# Patient Record
Sex: Female | Born: 1980 | Race: White | Hispanic: No | Marital: Married | Smoking: Never smoker
Health system: Southern US, Community
[De-identification: ages and names within clinical notes are randomized; demographics above are authoritative.]

## PROBLEM LIST (undated history)

## (undated) ENCOUNTER — Inpatient Hospital Stay (HOSPITAL_COMMUNITY): Payer: Self-pay

## (undated) DIAGNOSIS — T8859XA Other complications of anesthesia, initial encounter: Secondary | ICD-10-CM

## (undated) DIAGNOSIS — K2970 Gastritis, unspecified, without bleeding: Secondary | ICD-10-CM

## (undated) DIAGNOSIS — R102 Pelvic and perineal pain: Secondary | ICD-10-CM

## (undated) DIAGNOSIS — Z87442 Personal history of urinary calculi: Secondary | ICD-10-CM

## (undated) DIAGNOSIS — M797 Fibromyalgia: Secondary | ICD-10-CM

## (undated) DIAGNOSIS — G43909 Migraine, unspecified, not intractable, without status migrainosus: Secondary | ICD-10-CM

## (undated) DIAGNOSIS — Z862 Personal history of diseases of the blood and blood-forming organs and certain disorders involving the immune mechanism: Secondary | ICD-10-CM

## (undated) DIAGNOSIS — Z969 Presence of functional implant, unspecified: Secondary | ICD-10-CM

## (undated) DIAGNOSIS — Z872 Personal history of diseases of the skin and subcutaneous tissue: Secondary | ICD-10-CM

## (undated) DIAGNOSIS — R002 Palpitations: Secondary | ICD-10-CM

## (undated) DIAGNOSIS — T4145XA Adverse effect of unspecified anesthetic, initial encounter: Secondary | ICD-10-CM

## (undated) HISTORY — DX: Migraine, unspecified, not intractable, without status migrainosus: G43.909

## (undated) HISTORY — PX: DILATION AND EVACUATION: SHX1459

## (undated) HISTORY — PX: FOOT SURGERY: SHX648

## (undated) HISTORY — PX: WISDOM TOOTH EXTRACTION: SHX21

---
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
## 2006-07-28 ENCOUNTER — Ambulatory Visit (HOSPITAL_COMMUNITY): Admission: RE | Admit: 2006-07-28 | Discharge: 2006-07-28 | Payer: Self-pay | Admitting: Obstetrics and Gynecology

## 2007-01-12 ENCOUNTER — Inpatient Hospital Stay (HOSPITAL_COMMUNITY): Admission: AD | Admit: 2007-01-12 | Discharge: 2007-01-12 | Payer: Self-pay | Admitting: Obstetrics and Gynecology

## 2007-08-07 ENCOUNTER — Inpatient Hospital Stay (HOSPITAL_COMMUNITY): Admission: AD | Admit: 2007-08-07 | Discharge: 2007-08-07 | Payer: Self-pay | Admitting: Obstetrics and Gynecology

## 2007-09-03 ENCOUNTER — Inpatient Hospital Stay (HOSPITAL_COMMUNITY): Admission: RE | Admit: 2007-09-03 | Discharge: 2007-09-03 | Payer: Self-pay | Admitting: Obstetrics and Gynecology

## 2007-10-06 ENCOUNTER — Ambulatory Visit (HOSPITAL_COMMUNITY): Admission: RE | Admit: 2007-10-06 | Discharge: 2007-10-06 | Payer: Self-pay | Admitting: Obstetrics and Gynecology

## 2007-10-06 ENCOUNTER — Encounter (INDEPENDENT_AMBULATORY_CARE_PROVIDER_SITE_OTHER): Payer: Self-pay | Admitting: Obstetrics and Gynecology

## 2007-10-09 ENCOUNTER — Encounter (INDEPENDENT_AMBULATORY_CARE_PROVIDER_SITE_OTHER): Payer: Self-pay | Admitting: Obstetrics and Gynecology

## 2007-10-09 ENCOUNTER — Ambulatory Visit (HOSPITAL_COMMUNITY): Admission: AD | Admit: 2007-10-09 | Discharge: 2007-10-09 | Payer: Self-pay | Admitting: Obstetrics and Gynecology

## 2008-06-25 ENCOUNTER — Inpatient Hospital Stay (HOSPITAL_COMMUNITY): Admission: AD | Admit: 2008-06-25 | Discharge: 2008-06-25 | Payer: Self-pay | Admitting: Obstetrics and Gynecology

## 2008-07-21 ENCOUNTER — Inpatient Hospital Stay (HOSPITAL_COMMUNITY): Admission: AD | Admit: 2008-07-21 | Discharge: 2008-07-21 | Payer: Self-pay | Admitting: Obstetrics and Gynecology

## 2008-08-17 ENCOUNTER — Inpatient Hospital Stay (HOSPITAL_COMMUNITY): Admission: AD | Admit: 2008-08-17 | Discharge: 2008-08-17 | Payer: Self-pay | Admitting: Obstetrics and Gynecology

## 2008-12-18 ENCOUNTER — Ambulatory Visit (HOSPITAL_COMMUNITY): Admission: RE | Admit: 2008-12-18 | Discharge: 2008-12-18 | Payer: Self-pay | Admitting: Obstetrics and Gynecology

## 2009-02-06 ENCOUNTER — Ambulatory Visit (HOSPITAL_COMMUNITY): Admission: RE | Admit: 2009-02-06 | Discharge: 2009-02-06 | Payer: Self-pay | Admitting: Gynecology

## 2009-03-26 ENCOUNTER — Ambulatory Visit: Payer: Self-pay | Admitting: Vascular Surgery

## 2009-03-26 ENCOUNTER — Ambulatory Visit: Admission: RE | Admit: 2009-03-26 | Discharge: 2009-03-26 | Payer: Self-pay | Admitting: Orthopedic Surgery

## 2009-03-26 ENCOUNTER — Encounter (INDEPENDENT_AMBULATORY_CARE_PROVIDER_SITE_OTHER): Payer: Self-pay | Admitting: Orthopedic Surgery

## 2010-04-02 ENCOUNTER — Ambulatory Visit: Payer: Self-pay | Admitting: Physician Assistant

## 2010-04-02 ENCOUNTER — Inpatient Hospital Stay (HOSPITAL_COMMUNITY): Admission: AD | Admit: 2010-04-02 | Discharge: 2010-04-02 | Payer: Self-pay | Admitting: Obstetrics and Gynecology

## 2010-05-17 ENCOUNTER — Inpatient Hospital Stay (HOSPITAL_COMMUNITY): Admission: AD | Admit: 2010-05-17 | Discharge: 2010-05-17 | Payer: Self-pay | Admitting: Obstetrics and Gynecology

## 2010-06-08 ENCOUNTER — Ambulatory Visit: Admission: RE | Admit: 2010-06-08 | Discharge: 2010-06-08 | Payer: Self-pay | Admitting: Obstetrics and Gynecology

## 2010-07-02 ENCOUNTER — Inpatient Hospital Stay (HOSPITAL_COMMUNITY): Admission: RE | Admit: 2010-07-02 | Discharge: 2010-07-05 | Payer: Self-pay | Admitting: Obstetrics and Gynecology

## 2010-12-31 LAB — CBC
HCT: 28.9 % — ABNORMAL LOW (ref 36.0–46.0)
HCT: 36.1 % (ref 36.0–46.0)
MCV: 91.6 fL (ref 78.0–100.0)
MCV: 90.4 fL (ref 78.0–100.0)
Platelets: 145 10*3/uL — ABNORMAL LOW (ref 150–400)
RBC: 3.15 MIL/uL — ABNORMAL LOW (ref 3.87–5.11)
RBC: 3.99 MIL/uL (ref 3.87–5.11)
WBC: 13.8 10*3/uL — ABNORMAL HIGH (ref 4.0–10.5)
WBC: 12.1 10*3/uL — ABNORMAL HIGH (ref 4.0–10.5)

## 2010-12-31 LAB — SURGICAL PCR SCREEN: MRSA, PCR: NEGATIVE

## 2011-01-02 LAB — URINALYSIS, ROUTINE W REFLEX MICROSCOPIC
Glucose, UA: NEGATIVE mg/dL
Leukocytes, UA: NEGATIVE
Protein, ur: NEGATIVE mg/dL
Specific Gravity, Urine: 1.015 (ref 1.005–1.030)
Urobilinogen, UA: 0.2 mg/dL (ref 0.0–1.0)

## 2011-01-02 LAB — URINE CULTURE
Colony Count: NO GROWTH
Culture: NO GROWTH

## 2011-01-02 LAB — URINE MICROSCOPIC-ADD ON

## 2011-01-03 LAB — URINE MICROSCOPIC-ADD ON

## 2011-01-03 LAB — URINALYSIS, ROUTINE W REFLEX MICROSCOPIC
Bilirubin Urine: NEGATIVE
Leukocytes, UA: NEGATIVE
Nitrite: NEGATIVE
Specific Gravity, Urine: >1.03 — ABNORMAL HIGH (ref 1.005–1.030)
Urobilinogen, UA: 0.2 mg/dL (ref 0.0–1.0)
pH: 6 (ref 5.0–8.0)

## 2011-03-02 NOTE — Op Note (Signed)
NAME:  Alicia Wade, Alicia Wade             ACCOUNT NO.:  [id number]   MEDICAL RECORD NO.:  [id number]          PATIENT TYPE:  AMB   LOCATION:  [facility]                          FACILITY:  [facility]   PHYSICIAN:  Guy Sandifer. Henderson Cloud, M.D. DATE OF BIRTH:  10/06/1981   DATE OF PROCEDURE:  10/09/2007  DATE OF DISCHARGE:                               OPERATIVE REPORT   PREOPERATIVE DIAGNOSIS:  Menorrhagia, possible retained products of  conception.   POSTOPERATIVE DIAGNOSIS:  Menorrhagia, possible retained products of  conception.   PROCEDURE:  Dilatation evacuation with ultrasound guidance, and 1%  Xylocaine paracervical block.   SURGEON:  Guy Sandifer. Henderson Cloud, M.D.   ANESTHESIA:  MAC.   ANESTHESIOLOGIST:  Belva Agee, M.D.   SPECIMENS:  Endometrial curettings to pathology.   ESTIMATED BLOOD LOSS:  Minimal.   INDICATIONS AND CONSENT:  The patient is a 30 year old married white  female, status post D&E for a spontaneous AB 3 days ago.  She reports a  temperature of 100.3 on the first postoperative day, and approximately  99.5 yesterday.  Late last evening she had the onset of heavy cramping  that doubled her over.  It initially started with voiding, but then  generalized.  She was given Methergine, which was of no help.  She has  been on Macrobid for several days to treat a UTI.  She had some mild  nausea but no vomiting.  No bowel movement, but she has a history of  constipation.   Evaluation in triage revealed stable vital signs and she is afebrile.  The abdomen is flat, soft, with positive bowel sounds.  There is mild  tenderness in the lower quadrants bilaterally, with no rebound or  masses.   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  No flow was detected that would definitely be consistent with  POC.  White count is 8.9.  The possibility of a small amount of retained  POC or possibly simply clot decidua that is unable to pass the cervix is  discussed  with the patient and her mother.  Dilatation evacuation is  discussed.  Potential risks and complications were reviewed  preoperatively, including but limited to: infection, uterine  perforation, organ damage, bleeding requiring transfusion of blood  products with possible transfusion reaction, HIV and hepatitis  acquisition, DVT, PE, pneumonia, intrauterine synechia and secondary  infertility, and hysterectomy.  All questions were answered and consent  signed on the chart.   PROCEDURE:  The patient was taken to the operating room, where she was  identified, placed in the dorsal supine position and given intravenous  sedation.  She was then placed in the dorsal lithotomy position, where  she was prepped and the bladder straight catheterized.  Then she was  draped in a sterile fashion.  The anterior cervical lip was injected  with 1% Xylocaine plain, and grasped with a single-tooth tenaculum.  Approximately 20 mL of 1% plain Xylocaine was injected at the 2, 4, 5,  7, 8 and 10 o'clock positions.  The cervix was then gently and  progressively dilated to a 27  dilator.  Then, using simultaneous  ultrasound to visualize location, suction curettage was initially  carried out with a flexible #6 curet.  This was productive of a small  amount of clot.  Sharp curettage was then done into each fundus.  This  was followed with suction curettage with a #6 rigid curved suction  curet.  Several passes were made with each, and the cavity was clean.  There was good hemostasis.  All instruments were removed.  The patient  was taken to the recovery room in stable condition.   She was given Cipro 200 mg IV at the beginning of the case.  She will be  discharged home on Levaquin 500 mg daily (#5).  Will continue her on  Methergine for the next 1-2 days.   Instructions were reviewed.   FOLLOW-UP:  In the office in 1-2 weeks.      Guy Sandifer Henderson Cloud, M.D.  Electronically Signed     JET/MEDQ  D:   10/09/2007  T:  10/10/2007  Job:  [id number]

## 2011-03-02 NOTE — Op Note (Signed)
NAME:  Alicia Wade, Alicia Wade             ACCOUNT NO.:  [id number]   MEDICAL RECORD NO.:  [id number]          PATIENT TYPE:  AMB   LOCATION:  [facility]                           FACILITY:  [facility]   PHYSICIAN:  Michelle L. Grewal, M.D.DATE OF BIRTH:  1981-10-18   DATE OF PROCEDURE:  10/06/2007  DATE OF DISCHARGE:                               OPERATIVE REPORT   PREOPERATIVE DIAGNOSIS:  Missed abortion.   POSTOPERATIVE DIAGNOSIS:  Missed abortion.   PROCEDURE:  Dilatation and evacuation with chromosomes.   SURGEON:  Michelle L. Vincente Poli, M.D.   ANESTHESIA:  MAC with local.   FINDINGS:  Products of conception.   PROCEDURE IN DETAIL:  The patient was taken to the operating room after  informed consent was obtained.  She was then prepped and draped in the  usual sterile fashion.  In-and-out catheter was used to empty the  bladder.  The speculum was inserted.  The cervix was visualized and a  paracervical block was performed.  The cervical internal os was gently  dilated using Pratt dilators.  A #7 suction cannula was inserted into  the uterus and the uterus was thoroughly curetted. I did see some dark  clots and some tissue consistent with products of conception.  The  suction curette was removed and a sharp curette was inserted and the  uterus was then again curetted. A final suction curettage was performed.  The uterine cavity was clean.  All instruments were removed from the  vagina.  All sponge, lap and instrument counts were correct x2.      Michelle L. Vincente Poli, M.D.  Electronically Signed     MLG/MEDQ  D:  10/06/2007  T:  10/07/2007  Job:  [id number]

## 2011-07-23 LAB — CBC
HCT: 34.8 — ABNORMAL LOW
Hemoglobin: 12.3
MCHC: 34.9
MCHC: 35.4
MCV: 85.5
Platelets: 235
Platelets: 239
RDW: 12.2
RDW: 12.4
WBC: 6.3

## 2011-07-23 LAB — URINALYSIS, ROUTINE W REFLEX MICROSCOPIC
Nitrite: NEGATIVE
Protein, ur: NEGATIVE
Specific Gravity, Urine: 1.015
Urobilinogen, UA: 0.2

## 2011-07-23 LAB — URINE MICROSCOPIC-ADD ON

## 2011-07-23 LAB — ABO/RH: ABO/RH(D): A POS

## 2012-01-11 LAB — OB RESULTS CONSOLE ANTIBODY SCREEN: Antibody Screen: NEGATIVE

## 2012-01-11 LAB — OB RESULTS CONSOLE RPR: RPR: NONREACTIVE

## 2012-04-24 ENCOUNTER — Encounter (HOSPITAL_COMMUNITY): Payer: Self-pay

## 2012-04-24 ENCOUNTER — Observation Stay (HOSPITAL_COMMUNITY)
Admission: AD | Admit: 2012-04-24 | Discharge: 2012-04-27 | Disposition: A | Discharge status: Home / Self Care | Payer: BC Managed Care – PPO | Source: Ambulatory Visit | Attending: Obstetrics and Gynecology | Admitting: Obstetrics and Gynecology

## 2012-04-24 ENCOUNTER — Inpatient Hospital Stay (HOSPITAL_COMMUNITY): Payer: BC Managed Care – PPO

## 2012-04-24 DIAGNOSIS — O3400 Maternal care for unspecified congenital malformation of uterus, unspecified trimester: Secondary | ICD-10-CM | POA: Insufficient documentation

## 2012-04-24 DIAGNOSIS — R1013 Epigastric pain: Secondary | ICD-10-CM | POA: Insufficient documentation

## 2012-04-24 DIAGNOSIS — Q513 Bicornate uterus: Secondary | ICD-10-CM | POA: Insufficient documentation

## 2012-04-24 DIAGNOSIS — O99891 Other specified diseases and conditions complicating pregnancy: Principal | ICD-10-CM | POA: Insufficient documentation

## 2012-04-24 LAB — COMPREHENSIVE METABOLIC PANEL
AST: 19 U/L (ref 0–37)
BUN: 7 mg/dL (ref 6–23)
CO2: 25 mEq/L (ref 19–32)
Calcium: 8.7 mg/dL (ref 8.4–10.5)
Chloride: 103 mEq/L (ref 96–112)
Creatinine, Ser: 0.46 mg/dL — ABNORMAL LOW (ref 0.50–1.10)
GFR calc Af Amer: >90 mL/min (ref >90)
GFR calc non Af Amer: >90 mL/min (ref >90)
Glucose, Bld: 88 mg/dL (ref 70–99)
Total Bilirubin: 0.2 mg/dL — ABNORMAL LOW (ref 0.3–1.2)

## 2012-04-24 LAB — URIC ACID: Uric Acid, Serum: 4.2 mg/dL (ref 2.4–7.0)

## 2012-04-24 LAB — CBC WITH DIFFERENTIAL/PLATELET
Eosinophils Relative: 1 % (ref 0–5)
HCT: 30.7 % — ABNORMAL LOW (ref 36.0–46.0)
Hemoglobin: 10.4 g/dL — ABNORMAL LOW (ref 12.0–15.0)
Lymphocytes Relative: 14 % (ref 12–46)
Lymphs Abs: 1.5 10*3/uL (ref 0.7–4.0)
MCV: 87.7 fL (ref 78.0–100.0)
Monocytes Absolute: 0.5 10*3/uL (ref 0.1–1.0)
Monocytes Relative: 5 % (ref 3–12)
Neutro Abs: 8 10*3/uL — ABNORMAL HIGH (ref 1.7–7.7)
RDW: 14 % (ref 11.5–15.5)
WBC: 10 10*3/uL (ref 4.0–10.5)

## 2012-04-24 LAB — URINALYSIS, ROUTINE W REFLEX MICROSCOPIC
Bilirubin Urine: NEGATIVE
Glucose, UA: NEGATIVE mg/dL
Hgb urine dipstick: NEGATIVE
Protein, ur: NEGATIVE mg/dL
Specific Gravity, Urine: 1.01 (ref 1.005–1.030)

## 2012-04-24 LAB — AMYLASE: Amylase: 60 U/L (ref 0–105)

## 2012-04-24 MED ORDER — ONDANSETRON HCL 4 MG/2ML IJ SOLN
4.0000 mg | Freq: Once | INTRAMUSCULAR | Status: AC
  Administered 2012-04-24: 4 mg via INTRAVENOUS
  Filled 2012-04-24: qty 2

## 2012-04-24 MED ORDER — LACTATED RINGERS IV SOLN
INTRAVENOUS | Status: DC
  Administered 2012-04-24 – 2012-04-26 (×5): via INTRAVENOUS

## 2012-04-24 MED ORDER — LACTATED RINGERS IV SOLN
INTRAVENOUS | Status: DC
  Administered 2012-04-24 – 2012-04-27 (×2): via INTRAVENOUS

## 2012-04-24 MED ORDER — ONDANSETRON HCL 4 MG/2ML IJ SOLN
4.0000 mg | Freq: Four times a day (QID) | INTRAMUSCULAR | Status: DC | PRN
  Administered 2012-04-24 – 2012-04-26 (×2): 4 mg via INTRAVENOUS
  Filled 2012-04-24 (×5): qty 2

## 2012-04-24 MED ORDER — HYDROMORPHONE HCL PF 1 MG/ML IJ SOLN
1.0000 mg | INTRAMUSCULAR | Status: DC | PRN
  Administered 2012-04-24 – 2012-04-26 (×7): 1 mg via INTRAVENOUS
  Filled 2012-04-24 (×7): qty 1

## 2012-04-24 MED ORDER — DIPHENHYDRAMINE HCL 12.5 MG/5ML PO ELIX: 12.5000 mg | ORAL_SOLUTION | Freq: Four times a day (QID) | ORAL | Status: DC | PRN

## 2012-04-24 MED ORDER — HYDROMORPHONE HCL PF 1 MG/ML IJ SOLN
0.5000 mg | Freq: Once | INTRAMUSCULAR | Status: AC
  Administered 2012-04-24: 0.5 mg via INTRAVENOUS
  Filled 2012-04-24: qty 1

## 2012-04-24 MED ORDER — DIPHENHYDRAMINE HCL 50 MG/ML IJ SOLN
12.5000 mg | Freq: Four times a day (QID) | INTRAMUSCULAR | Status: DC | PRN
  Administered 2012-04-26: 12.5 mg via INTRAVENOUS
  Filled 2012-04-24: qty 1

## 2012-04-24 MED ORDER — HYDROMORPHONE HCL PF 1 MG/ML IJ SOLN
0.5000 mg | Freq: Once | INTRAMUSCULAR | Status: AC
  Administered 2012-04-24: 11:00:00 via INTRAVENOUS
  Filled 2012-04-24: qty 1

## 2012-04-24 MED ORDER — ONDANSETRON HCL 4 MG/2ML IJ SOLN
4.0000 mg | Freq: Four times a day (QID) | INTRAMUSCULAR | Status: DC | PRN
  Administered 2012-04-24 – 2012-04-25 (×4): 4 mg via INTRAVENOUS
  Filled 2012-04-24: qty 2

## 2012-04-24 MED ORDER — NALOXONE HCL 0.4 MG/ML IJ SOLN: 0.4000 mg | INTRAMUSCULAR | Status: DC | PRN

## 2012-04-24 MED ORDER — SODIUM CHLORIDE 0.9 % IJ SOLN: 9.0000 mL | INTRAMUSCULAR | Status: DC | PRN

## 2012-04-24 MED ORDER — FAMOTIDINE IN NACL 20-0.9 MG/50ML-% IV SOLN
20.0000 mg | Freq: Once | INTRAVENOUS | Status: AC
  Administered 2012-04-24: 20 mg via INTRAVENOUS
  Filled 2012-04-24: qty 50

## 2012-04-24 MED ORDER — HYDROMORPHONE 0.3 MG/ML IV SOLN: INTRAVENOUS | Status: DC

## 2012-04-24 NOTE — H&P (Signed)
Alicia Wade is a 31 y.o. female presenting at 23 weeks with abdominal pain. Reports the onset of epigastric discomfort with radiation to both lower quadrants and the umbilical area. She has had some nausea associated with this. Other change in bowel habits. Denies any fever. Patient does state she has a history of kidney stones.        Rations been evaluated and maternity admissions. We did an ultrasound of the gallbladder and upper abdomen which was negative. No signs of gallstones or obstructing kidney stones. The ultrasound revealed normal amniotic fluid and movement. There was no sign of abruption. Monitoring revealed no contractions and a reassuring fetal heart rate. She had no uterine tenderness. At this point time her daughter bring her in for observation overnight. Certainly be a developing appendicitis. Although I think this is unlikely.    Maternal Medical History:  Reason for admission: Reason for admission: nausea.  Fetal activity: Perceived fetal activity is normal.      OB History    Grav Para Term Preterm Abortions TAB SAB Ect Mult Living   4 1 1  2  2   1      Past Medical History  Diagnosis Date  . Bicornate uterus   . Anemia   . Kidney stones    Past Surgical History  Procedure Date  . Cesarean section   . Wisdom tooth extraction   . Foot surgery   . Dilation and curettage of uterus    Family History: family history is negative for Other. Social History:  reports that she has never smoked. She has never used smokeless tobacco. She reports that she does not drink alcohol or use illicit drugs.   Prenatal Transfer Tool   Genetic Screening: Normal Maternal Ultrasounds/Referrals: Normal Fetal Ultrasounds or other Referrals:  None Maternal Substance Abuse:  No Significant Maternal Medications:  None Significant Maternal Lab Results:  None Other Comments:  None  Review of Systems  Constitutional: Positive for fever. Negative for chills.  Respiratory:  Negative for cough, shortness of breath and wheezing.   Gastrointestinal: Positive for nausea and abdominal pain. Negative for diarrhea, constipation, blood in stool and melena.  Genitourinary: Negative for dysuria, urgency, frequency and hematuria.      Blood pressure 115/71, pulse 77, temperature 98.5 F (36.9 C), temperature source Oral, resp. rate 16, height 5' 2.5" (1.588 m), weight 67.042 kg (147 lb 12.8 oz). Exam Physical Exam  Constitutional: She is oriented to person, place, and time. She appears well-developed and well-nourished.  HENT:  Head: Normocephalic and atraumatic.  Mouth/Throat: Oropharynx is clear and moist.  Eyes: Conjunctivae and EOM are normal. Pupils are equal, round, and reactive to light.  Cardiovascular: Normal rate, regular rhythm and normal heart sounds.   Respiratory: Effort normal and breath sounds normal.  GI: Soft. She exhibits no distension and no mass. There is tenderness. There is no rebound and no guarding.  Musculoskeletal: She exhibits no edema and no tenderness.  Neurological: She is alert and oriented to person, place, and time. She has normal reflexes.    Prenatal labs: ABO, Rh:   Antibody:   Rubella:   RPR:    HBsAg:    HIV:    GBS:     Assessment/Plan: Treated in pregnancy at 23 weeks. Nominal discomfort of undetermined etiology  A she will brought in for observation tonight. We'll repeat labs tomorrow. If she has any signs of worsening symptomatology a general surgical consultation will be obtained. Potential we may need  to do a CT scan to rule out appendicitis.   Alicia Wade S 04/24/2012, 2:01 PM

## 2012-04-24 NOTE — MAU Provider Note (Signed)
History     CSN: [id number]  Arrival date & time 04/24/12  0645   None     Chief Complaint  Patient presents with  . Abdominal Pain    HPI 08:00 am Alicia Wade is a 31 y.o. female @ [redacted]w[redacted]d gestation who presents to MAU for abdominal pain. The pain started approximately 3 am. She describes the pain as a sharp, constant and is equal to kidney stone pain. She reports nausea but no vomiting. The pain is located in the upper abdomen and radiates to the epigastric area. She has never had this pain before. The history was provided by the patient.  Past Medical History  Diagnosis Date  . Bicornate uterus   . Anemia   . Kidney stones     Past Surgical History  Procedure Date  . Cesarean section   . Wisdom tooth extraction   . Foot surgery   . Dilation and curettage of uterus     Family History  Problem Relation Age of Onset  . Other Neg Hx     History  Substance Use Topics  . Smoking status: Never Smoker   . Smokeless tobacco: Never Used  . Alcohol Use: No    OB History    Grav Para Term Preterm Abortions TAB SAB Ect Mult Living   4 1 1  2  2   1       Review of Systems  Constitutional: Negative for fever and chills.  HENT: Negative.   Eyes: Negative.   Respiratory: Negative.   Cardiovascular: Negative.   Gastrointestinal: Positive for nausea, vomiting and abdominal pain.  Genitourinary: Negative for dysuria, frequency, flank pain, vaginal bleeding, vaginal discharge and pelvic pain.  Musculoskeletal: Negative for back pain.  Skin: Negative.   Neurological: Negative for headaches.  Psychiatric/Behavioral: Negative for confusion and agitation.    Allergies  Augmentin; Penicillins; and Zithromax  Home Medications  No current outpatient prescriptions on file.  BP 115/71  Pulse 77  Temp 98.5 F (36.9 C) (Oral)  Resp 16  Ht 5' 2.5" (1.588 m)  Wt 147 lb 12.8 oz (67.042 kg)  BMI 26.60 kg/m2  Physical Exam  Nursing note and vitals  reviewed. Constitutional: She is oriented to person, place, and time. She appears well-developed and well-nourished. No distress.  HENT:  Head: Normocephalic.  Eyes: EOM are normal.  Neck: Neck supple.  Cardiovascular: Normal rate.   Pulmonary/Chest: Effort normal.  Abdominal: Soft. There is tenderness in the right upper quadrant, epigastric area and left upper quadrant. There is no CVA tenderness.  Musculoskeletal: Normal range of motion.  Neurological: She is alert and oriented to person, place, and time. No cranial nerve deficit.  Skin: Skin is warm and dry.  Psychiatric: She has a normal mood and affect. Her behavior is normal. Judgment and thought content normal.     ED Course: 08:15 am Dr. Arelia Sneddon in to evaluate the patient.  Procedures Assessment: 31 y.o. @ [redacted]w[redacted]d with abdominal pain  Plan:    Labs, ultrasound, IV, Dilaudid, Pepcid and Zofran ordered   Dr. Arelia Sneddon in to continue care of patient and admission   MDM

## 2012-04-24 NOTE — Progress Notes (Signed)
Patient admitted with a pain scale of 2, states she is more comfortable now than this morning. Pt stated she would rather not have the pca/oxygen for relief and wouuld like to try prn meds for now. Will notify md.

## 2012-04-24 NOTE — MAU Note (Signed)
Patient states she woke up at 0300 with waves of stabbing intense pain in the epigastric area that shoots down the middle of the abdomen. Denies any leaking or bleeding and reports fetal movement.

## 2012-04-24 NOTE — MAU Note (Signed)
Pt states started having pain in upper abdomen at 0330, rates pain at 7-8/10. Has waves of increased intensity. Denies abnormal vaginal discharge or bleeding.

## 2012-04-25 LAB — CBC
HCT: 30.3 % — ABNORMAL LOW (ref 36.0–46.0)
Hemoglobin: 10.2 g/dL — ABNORMAL LOW (ref 12.0–15.0)
MCHC: 33.7 g/dL (ref 30.0–36.0)
RDW: 14.2 % (ref 11.5–15.5)
WBC: 7.5 10*3/uL (ref 4.0–10.5)

## 2012-04-25 MED ORDER — FAMOTIDINE IN NACL 20-0.9 MG/50ML-% IV SOLN
20.0000 mg | Freq: Two times a day (BID) | INTRAVENOUS | Status: DC
  Administered 2012-04-25 (×2): 20 mg via INTRAVENOUS
  Filled 2012-04-25 (×3): qty 50

## 2012-04-25 NOTE — Progress Notes (Signed)
UR Chart review completed.  

## 2012-04-25 NOTE — Progress Notes (Signed)
Patient ID: Alicia Wade, female   DOB: Feb 23, 1981, 31 y.o.   MRN: [id number] S: epigastric pain continues , rates 5/10, No radiation this am. Vomited x 1 last pm . Some nausea this am. Denies contracttions, LOF or VB O: VSS Abd sof,t + BS, slight tenderness epigastric area with palpitation  No CVAT, Gravid uterus, + FM  Labs WNL with WBC decreased- 7.5  Abd ultrasound neg , no evidence or GB thickening or stones, no evidence of kidney stone or obstruction OB ultrasound with normal amniotic fluid , normal appearing placenta , Uterus noted to be bicornate with fetus in R horn A: IUP 23 weeks with abd pain of uncertain etiology  P: Pepcid 20 mg IV q 12 hrs  Change diet to bland diet to see how she tolerates

## 2012-04-26 MED ORDER — FAMOTIDINE 20 MG PO TABS
20.0000 mg | ORAL_TABLET | Freq: Two times a day (BID) | ORAL | Status: DC
  Administered 2012-04-26 (×2): 20 mg via ORAL
  Filled 2012-04-26 (×2): qty 1

## 2012-04-26 MED ORDER — HYDROCODONE-ACETAMINOPHEN 5-325 MG PO TABS
1.0000 | ORAL_TABLET | ORAL | Status: DC | PRN
  Administered 2012-04-26 – 2012-04-27 (×5): 1 via ORAL
  Filled 2012-04-26 (×5): qty 1

## 2012-04-26 MED ORDER — ONDANSETRON 4 MG PO TBDP
4.0000 mg | ORAL_TABLET | Freq: Three times a day (TID) | ORAL | Status: DC | PRN
  Filled 2012-04-26: qty 1

## 2012-04-26 NOTE — Progress Notes (Signed)
Patient ID: Alicia Wade, female   DOB: [birth year], 31 y.o.   MRN: [id number] S: Epigastric burning and regurg significantly improved. Continues to experience stabbing epigastric pain but less frequent. Denies contractions, LOF , or VB. +FM. Reports some periumbilical pain/pressure with sitting O: VSS, Abd soft + BM yesterday. Tolerated soft diet yesterday Gravid uterus 2 above umbilicus A: 23 weeks IUP with epigastric pain, improving P: Change IV meds to po Consider discontinuing IV fluids later this afternoon if tolerates po meds and diet

## 2012-04-26 NOTE — Progress Notes (Signed)
Ur chart review completed.  

## 2012-04-27 MED ORDER — ONDANSETRON 4 MG PO TBDP: 4.0000 mg | ORAL_TABLET | Freq: Three times a day (TID) | ORAL | Status: AC | PRN

## 2012-04-27 MED ORDER — PANTOPRAZOLE SODIUM 40 MG PO TBEC: 40.0000 mg | DELAYED_RELEASE_TABLET | Freq: Every day | ORAL | Status: DC

## 2012-04-27 MED ORDER — HYDROCODONE-ACETAMINOPHEN 5-325 MG PO TABS: 1.0000 | ORAL_TABLET | ORAL | Status: AC | PRN

## 2012-04-27 MED ORDER — PANTOPRAZOLE SODIUM 40 MG PO TBEC
40.0000 mg | DELAYED_RELEASE_TABLET | Freq: Every day | ORAL | Status: DC
  Administered 2012-04-27: 40 mg via ORAL
  Filled 2012-04-27 (×2): qty 1

## 2012-04-27 MED ORDER — DOCUSATE SODIUM 100 MG PO CAPS
100.0000 mg | ORAL_CAPSULE | Freq: Every day | ORAL | Status: DC
  Administered 2012-04-27: 100 mg via ORAL
  Filled 2012-04-27: qty 1

## 2012-04-27 NOTE — Discharge Summary (Signed)
Physician Discharge Summary  Patient ID: Alicia Wade MRN: [id number] DOB/AGE: 1980-10-19 30 y.o.  Admit date: 04/24/2012 Discharge date: 04/27/2012  Admission Diagnoses: 23 week IUP admitted with epigastric pain  Discharge Diagnoses:  23 week IUP, gastritis Active Problems:  * No active hospital problems. *    Discharged Condition: stable  Hospital Course: 30 yr WMF admitted aat [redacted] weeks gestation with complaints of epigatric pain with radiation to umbilicus. ABd and pelvic ultrasound were performed which did not reveal evidence of gallstones or slug or wall thickening. No evidence of renal calculi was noted.  Amniotic fluid was normal as well as placenta. Uterus was noted to be bicornate and fetus was seen in the R horn.  Patient was started on IV fluids, pain meds and Pepcid. Diet was later started which she tolerated well. No further nausea. Patient was discharged home on pain meds and protonix  Consults: None  Significant Diagnostic Studies: labs: TSH, CBC, urinalysis Abd ultasound and OB ultrasound  Treatments: IV hydration, analgesia: Dilaudid and PPI  Discharge Exam: Blood pressure 98/63, pulse 67, temperature 98.2 F (36.8 C), temperature source Oral, resp. rate 16, height 5' 2.5" (1.588 m), weight 66.679 kg (147 lb), SpO2 99.00%. General appearance: alert and cooperative Abd soft mild epigatric tenderness , gravid uterus 3 above umbilicus + FM, negative flank pain . No vag bleeding, LOF   Disposition:    Medication List  As of 04/27/2012  2:03 PM   STOP taking these medications         ranitidine 150 MG tablet         TAKE these medications         docusate sodium 100 MG capsule   Commonly known as: COLACE   Take 100 mg by mouth at bedtime.      HYDROcodone-acetaminophen 5-325 MG per tablet   Commonly known as: NORCO   Take 1 tablet by mouth every 4 (four) hours as needed.      ondansetron 4 MG disintegrating tablet   Commonly known as: ZOFRAN-ODT   Take  1 tablet (4 mg total) by mouth every 8 (eight) hours as needed.      pantoprazole 40 MG tablet   Commonly known as: PROTONIX   Take 1 tablet (40 mg total) by mouth daily at 12 noon.      prenatal multivitamin Tabs   Take 1 tablet by mouth at bedtime.             SignedJudith Blonder 04/27/2012, 2:03 PM

## 2012-04-27 NOTE — Progress Notes (Signed)
Subjective:[redacted]w[redacted]d  Patient reports tolerating PO.    Objective: I have reviewed patient's vital signs, medications, labs and radiology results. BP 90/55  Pulse 85  Temp 98.1 F (36.7 C) (Oral)  Resp 16  Ht 5' 2.5" (1.588 m)  Wt 147 lb (66.679 kg)  BMI 26.46 kg/m2  SpO2 100% fundus nontender, mildly tender @ epigastrium, no reb   Assessment/Plan: prob gastritis, NEG abd [place] with normal labs, feeling better , D/C on PO Protonix + bland diet, check back in office 1 wk  LOS: 3 days    Hemi Chacko M 04/27/2012, 7:58 AM

## 2012-04-29 ENCOUNTER — Inpatient Hospital Stay (HOSPITAL_COMMUNITY)
Admission: AD | Admit: 2012-04-29 | Discharge: 2012-04-29 | Disposition: A | Discharge status: Home / Self Care | Payer: BC Managed Care – PPO | Source: Ambulatory Visit | Attending: Obstetrics and Gynecology | Admitting: Obstetrics and Gynecology

## 2012-04-29 ENCOUNTER — Encounter (HOSPITAL_COMMUNITY): Payer: Self-pay

## 2012-04-29 DIAGNOSIS — R109 Unspecified abdominal pain: Secondary | ICD-10-CM

## 2012-04-29 DIAGNOSIS — O4700 False labor before 37 completed weeks of gestation, unspecified trimester: Secondary | ICD-10-CM

## 2012-04-29 LAB — URINALYSIS, ROUTINE W REFLEX MICROSCOPIC
Hgb urine dipstick: NEGATIVE
Leukocytes, UA: NEGATIVE
Protein, ur: NEGATIVE mg/dL
Specific Gravity, Urine: >1.03 — ABNORMAL HIGH (ref 1.005–1.030)
Urobilinogen, UA: 0.2 mg/dL (ref 0.0–1.0)

## 2012-04-29 NOTE — MAU Note (Signed)
Patient brought straight in to rm 3. In with c/o contractions all day and since 2015 more frequent. She denies any vaginal bleeding, lof or discharge. She reports good fetal movement.

## 2012-04-29 NOTE — MAU Provider Note (Signed)
History     CSN: [id number]  Arrival date and time: 04/29/12 2118   First Provider Initiated Contact with Patient 04/29/12 2139      Chief Complaint  Patient presents with  . Contractions   HPI Alicia Wade 30 y.o. [redacted]w[redacted]d  Having contractions today since 1 pm.  Contractions progressed until having 4 contractions in one hour with lower abdominal pain and low back pain.  Concerned about preterm labor especially with history of bicornate uterus.  Was just discharged from the hospital on 04-24-12 after evaluation of upper abdominal pain.  Did take one vicodin for pain at home today at 3 pm.  Still continued to have lower abdominal pain.  OB History    Grav Para Term Preterm Abortions TAB SAB Ect Mult Living   4 1 1  2  2   1       Past Medical History  Diagnosis Date  . Bicornate uterus   . Anemia   . Kidney stones     Past Surgical History  Procedure Date  . Cesarean section   . Wisdom tooth extraction   . Foot surgery   . Dilation and curettage of uterus     Family History  Problem Relation Age of Onset  . Other Neg Hx     History  Substance Use Topics  . Smoking status: Never Smoker   . Smokeless tobacco: Never Used  . Alcohol Use: No    Allergies:  Allergies  Allergen Reactions  . Augmentin (Amoxicillin-Pot Clavulanate) Diarrhea and Nausea And Vomiting  . Penicillins Hives  . Zithromax (Azithromycin) Diarrhea and Nausea And Vomiting    Prescriptions prior to admission  Medication Sig Dispense Refill  . docusate sodium (COLACE) 100 MG capsule Take 100 mg by mouth at bedtime.      Marland Kitchen HYDROcodone-acetaminophen (NORCO) 5-325 MG per tablet Take 1 tablet by mouth every 4 (four) hours as needed.  30 tablet  0  . ondansetron (ZOFRAN-ODT) 4 MG disintegrating tablet Take 1 tablet (4 mg total) by mouth every 8 (eight) hours as needed.  20 tablet  0  . pantoprazole (PROTONIX) 40 MG tablet Take 1 tablet (40 mg total) by mouth daily at 12 noon.  30 tablet  1  .  Prenatal Vit-Fe Fumarate-FA (PRENATAL MULTIVITAMIN) TABS Take 1 tablet by mouth at bedtime.        ROS Physical Exam   Blood pressure 104/63, pulse 90, temperature 99.2 F (37.3 C), temperature source Oral, resp. rate 18.  Physical Exam  Nursing note and vitals reviewed. Constitutional: She is oriented to person, place, and time. She appears well-developed and well-nourished.  HENT:  Head: Normocephalic.  Eyes: EOM are normal.  Neck: Neck supple.  GI: Soft. There is no tenderness.       No contractions seen on monitor. FHT stable.  Baby moving well.  Genitourinary:       Speculum exam: Vagina - Mod amount of creamy discharge, no odor Cervix - No contact bleeding Bimanual exam: Cervix closed, thick Chaperone present for exam.  Musculoskeletal: Normal range of motion.  Neurological: She is alert and oriented to person, place, and time.  Skin: Skin is warm and dry.  Psychiatric: She has a normal mood and affect.    MAU Course  Procedures Results for orders placed during the hospital encounter of 04/29/12 (from the past 24 hour(s))  URINALYSIS, ROUTINE W REFLEX MICROSCOPIC     Status: Abnormal   Collection Time   04/29/12  9:24 PM      Component Value Range   Color, Urine YELLOW  YELLOW   APPearance CLEAR  CLEAR   Specific Gravity, Urine >1.030 (*) 1.005 - 1.030   pH 5.5  5.0 - 8.0   Glucose, UA NEGATIVE  NEGATIVE mg/dL   Hgb urine dipstick NEGATIVE  NEGATIVE   Bilirubin Urine NEGATIVE  NEGATIVE   Ketones, ur NEGATIVE  NEGATIVE mg/dL   Protein, ur NEGATIVE  NEGATIVE mg/dL   Urobilinogen, UA 0.2  0.0 - 1.0 mg/dL   Nitrite NEGATIVE  NEGATIVE   Leukocytes, UA NEGATIVE  NEGATIVE   MDM 2200  Consult with Dr. Renaldo Fiddler re: plan of care  Assessment and Plan  Abd pain in pregnancy  Plan Drink at least 8 8-oz glasses of water every day. Likely pain today is due to mild dehydration Keep appointments in the office. Call your doctor if you have questions or  concerns.  BURLESON,TERRI 04/29/2012, 10:01 PM

## 2012-05-28 ENCOUNTER — Encounter (HOSPITAL_COMMUNITY): Payer: Self-pay | Admitting: *Deleted

## 2012-05-28 ENCOUNTER — Encounter (HOSPITAL_COMMUNITY): Payer: Self-pay | Admitting: Advanced Practice Midwife

## 2012-05-28 ENCOUNTER — Inpatient Hospital Stay (HOSPITAL_COMMUNITY)
Admission: AD | Admit: 2012-05-28 | Discharge: 2012-05-28 | Disposition: A | Discharge status: Home / Self Care | Payer: BC Managed Care – PPO | Source: Ambulatory Visit | Attending: Obstetrics and Gynecology | Admitting: Obstetrics and Gynecology

## 2012-05-28 DIAGNOSIS — O26859 Spotting complicating pregnancy, unspecified trimester: Secondary | ICD-10-CM | POA: Insufficient documentation

## 2012-05-28 DIAGNOSIS — O4700 False labor before 37 completed weeks of gestation, unspecified trimester: Secondary | ICD-10-CM | POA: Insufficient documentation

## 2012-05-28 DIAGNOSIS — O4690 Antepartum hemorrhage, unspecified, unspecified trimester: Secondary | ICD-10-CM

## 2012-05-28 DIAGNOSIS — R109 Unspecified abdominal pain: Secondary | ICD-10-CM | POA: Insufficient documentation

## 2012-05-28 LAB — URINALYSIS, ROUTINE W REFLEX MICROSCOPIC
Glucose, UA: NEGATIVE mg/dL
Ketones, ur: NEGATIVE mg/dL
Leukocytes, UA: NEGATIVE
Nitrite: NEGATIVE
Protein, ur: NEGATIVE mg/dL
Urobilinogen, UA: 0.2 mg/dL (ref 0.0–1.0)

## 2012-05-28 NOTE — MAU Note (Signed)
Pt reports when she went to restroom at 1800 she had some spotting. She has had preterm contractions but no cervical change. Reports bicornate uterus. States she has had "discomfort"  In lower abd and on left side all weekend.

## 2012-05-28 NOTE — MAU Provider Note (Signed)
History     CSN: [id number]  Arrival date and time: 05/28/12 2052   First Provider Initiated Contact with Patient 05/28/12 2148      No chief complaint on file.  HPI This is a 31 y.o. female at [redacted]w[redacted]d who presents with c/o cramping and spotting this evening (blood on tissue).  Has a known Bicornuate uterus.  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  RN Note: Pt presents with abd pain since Friday with increase in cramping and spotting today at 1800. Pt has hx of c/s, bicornorate uterus, and kidney stones. Pt states it does not feel like kidney stones but she and her s/o felt a "lump in her lower ledt abd close to her c/s incision where the pain is increasing today.   OB History    Grav Para Term Preterm Abortions TAB SAB Ect Mult Living   4 1 1  2  2   1       Past Medical History  Diagnosis Date  . Bicornate uterus   . Anemia   . Kidney stones     Past Surgical History  Procedure Date  . Cesarean section   . Wisdom tooth extraction   . Foot surgery   . Dilation and curettage of uterus     Family History  Problem Relation Age of Onset  . Other Neg Hx     History  Substance Use Topics  . Smoking status: Never Smoker   . Smokeless tobacco: Never Used  . Alcohol Use: No    Allergies:  Allergies  Allergen Reactions  . Augmentin (Amoxicillin-Pot Clavulanate) Diarrhea and Nausea And Vomiting  . Penicillins Hives  . Zithromax (Azithromycin) Diarrhea and Nausea And Vomiting    Prescriptions prior to admission  Medication Sig Dispense Refill  . docusate sodium (COLACE) 100 MG capsule Take 100 mg by mouth at bedtime.      Marland Kitchen Fe Cbn-Fe Gluc-FA-B12-C-DSS (FERRALET 90 PO) Take 1 tablet by mouth daily.      . pantoprazole (PROTONIX) 40 MG tablet Take 1 tablet (40 mg total) by mouth daily at 12 noon.  30 tablet  1  . Prenatal Vit-Fe Fumarate-FA (PRENATAL MULTIVITAMIN) TABS Take 1 tablet by mouth at bedtime.        ROS As in  HPI  Physical Exam   Blood pressure 99/66, pulse 82, temperature 98.2 F (36.8 C), temperature source Oral, resp. rate 18.  Physical Exam  Constitutional: She is oriented to person, place, and time. She appears well-developed and well-nourished. No distress.  Cardiovascular: Normal rate.   Respiratory: Effort normal.  GI: Soft. She exhibits no distension and no mass. There is tenderness (mild,over left side of old C/S scar). There is no rebound and no guarding.       Fetal small parts palpable under left side of scar  Genitourinary: Uterus normal. Vaginal discharge (white, mucous, no evidence of blood anywhere in vault) found.       Cervix long/closed  Musculoskeletal: Normal range of motion. She exhibits no edema.  Neurological: She is alert and oriented to person, place, and time.  Skin: Skin is warm and dry.  Psychiatric: She has a normal mood and affect.   Dilation: Closed Effacement (%): Thick Station: -3 Exam by:: m. Marithza Malachi, cnm  FHR reassuring No contractions seen. States has only felt one Results for orders placed during the hospital encounter of 05/28/12 (from the past 24 hour(s))  URINALYSIS, ROUTINE W  REFLEX MICROSCOPIC     Status: Abnormal   Collection Time   05/28/12  8:15 PM      Component Value Range   Color, Urine YELLOW  YELLOW   APPearance CLEAR  CLEAR   Specific Gravity, Urine <1.005 (*) 1.005 - 1.030   pH 6.5  5.0 - 8.0   Glucose, UA NEGATIVE  NEGATIVE mg/dL   Hgb urine dipstick NEGATIVE  NEGATIVE   Bilirubin Urine NEGATIVE  NEGATIVE   Ketones, ur NEGATIVE  NEGATIVE mg/dL   Protein, ur NEGATIVE  NEGATIVE mg/dL   Urobilinogen, UA 0.2  0.0 - 1.0 mg/dL   Nitrite NEGATIVE  NEGATIVE   Leukocytes, UA NEGATIVE  NEGATIVE    MAU Course  Procedures  Assessment and Plan  A:  SIUP at [redacted]w[redacted]d       Braxton Hicks contractions without cervical change      No evidence of vaginal bleeding      Left ligament pain vs pain in scar tissue  P:  Discussed with Dr  Arelia Sneddon      Discharge      Comfort measures      Bleeding precautions      Keep appt for tomorrow in office  [facility] 05/28/2012, 10:07 PM

## 2012-05-28 NOTE — MAU Note (Signed)
Pt presents with abd pain since Friday with increase in cramping and spotting today at 1800.  Pt has hx of c/s, bicornorate uterus, and kidney stones.  Pt states it does not feel like kidney stones but she and her s/o felt a "lump in her lower ledt abd close to her c/s incision where the pain is increasing today.

## 2012-07-27 ENCOUNTER — Encounter (HOSPITAL_COMMUNITY): Payer: Self-pay | Admitting: Student-PharmD

## 2012-07-31 ENCOUNTER — Encounter (HOSPITAL_COMMUNITY): Payer: Self-pay | Admitting: *Deleted

## 2012-07-31 ENCOUNTER — Inpatient Hospital Stay (HOSPITAL_COMMUNITY)
Admission: RE | Admit: 2012-07-31 | Discharge: 2012-08-03 | DRG: 371 | Disposition: A | Discharge status: Home / Self Care | Payer: BC Managed Care – PPO | Source: Ambulatory Visit | Attending: Obstetrics and Gynecology | Admitting: Obstetrics and Gynecology

## 2012-07-31 ENCOUNTER — Encounter (HOSPITAL_COMMUNITY): Payer: Self-pay | Admitting: Anesthesiology

## 2012-07-31 ENCOUNTER — Inpatient Hospital Stay (HOSPITAL_COMMUNITY): Payer: BC Managed Care – PPO | Admitting: Anesthesiology

## 2012-07-31 ENCOUNTER — Encounter (HOSPITAL_COMMUNITY)
Admission: RE | Disposition: A | Discharge status: Home / Self Care | Payer: Self-pay | Source: Ambulatory Visit | Attending: Obstetrics and Gynecology

## 2012-07-31 DIAGNOSIS — O34219 Maternal care for unspecified type scar from previous cesarean delivery: Principal | ICD-10-CM | POA: Diagnosis present

## 2012-07-31 DIAGNOSIS — O321XX0 Maternal care for breech presentation, not applicable or unspecified: Secondary | ICD-10-CM | POA: Diagnosis present

## 2012-07-31 LAB — CBC
HCT: 34.1 % — ABNORMAL LOW (ref 36.0–46.0)
Hemoglobin: 11.4 g/dL — ABNORMAL LOW (ref 12.0–15.0)
RBC: 3.87 MIL/uL (ref 3.87–5.11)
WBC: 10.5 10*3/uL (ref 4.0–10.5)

## 2012-07-31 LAB — TYPE AND SCREEN
ABO/RH(D): A POS
Antibody Screen: NEGATIVE

## 2012-07-31 LAB — SURGICAL PCR SCREEN
MRSA, PCR: NEGATIVE
Staphylococcus aureus: NEGATIVE

## 2012-07-31 SURGERY — Surgical Case
Anesthesia: Spinal | Site: Abdomen | Wound class: Clean Contaminated

## 2012-07-31 MED ORDER — SENNOSIDES-DOCUSATE SODIUM 8.6-50 MG PO TABS
2.0000 | ORAL_TABLET | Freq: Every day | ORAL | Status: DC
  Administered 2012-07-31 – 2012-08-02 (×3): 2 via ORAL

## 2012-07-31 MED ORDER — ONDANSETRON HCL 4 MG/2ML IJ SOLN: 4.0000 mg | INTRAMUSCULAR | Status: DC | PRN

## 2012-07-31 MED ORDER — EPHEDRINE 5 MG/ML INJ
INTRAVENOUS | Status: AC
  Filled 2012-07-31: qty 10

## 2012-07-31 MED ORDER — ONDANSETRON HCL 4 MG/2ML IJ SOLN
INTRAMUSCULAR | Status: DC | PRN
  Administered 2012-07-31: 4 mg via INTRAVENOUS

## 2012-07-31 MED ORDER — PROMETHAZINE HCL 25 MG/ML IJ SOLN: 6.2500 mg | INTRAMUSCULAR | Status: DC | PRN

## 2012-07-31 MED ORDER — HYDROMORPHONE HCL PF 1 MG/ML IJ SOLN: 0.2500 mg | INTRAMUSCULAR | Status: DC | PRN

## 2012-07-31 MED ORDER — SCOPOLAMINE 1 MG/3DAYS TD PT72
1.0000 | MEDICATED_PATCH | Freq: Once | TRANSDERMAL | Status: DC
  Administered 2012-07-31: 1.5 mg via TRANSDERMAL

## 2012-07-31 MED ORDER — LACTATED RINGERS IV SOLN
INTRAVENOUS | Status: DC
  Administered 2012-07-31 (×2): via INTRAVENOUS

## 2012-07-31 MED ORDER — MENTHOL 3 MG MT LOZG: 1.0000 | LOZENGE | OROMUCOSAL | Status: DC | PRN

## 2012-07-31 MED ORDER — SIMETHICONE 80 MG PO CHEW
80.0000 mg | CHEWABLE_TABLET | Freq: Three times a day (TID) | ORAL | Status: DC
  Administered 2012-07-31 – 2012-08-03 (×8): 80 mg via ORAL

## 2012-07-31 MED ORDER — PRENATAL MULTIVITAMIN CH
1.0000 | ORAL_TABLET | Freq: Every day | ORAL | Status: DC
  Administered 2012-08-01 – 2012-08-03 (×3): 1 via ORAL
  Filled 2012-07-31 (×3): qty 1

## 2012-07-31 MED ORDER — ZOLPIDEM TARTRATE 5 MG PO TABS: 5.0000 mg | ORAL_TABLET | Freq: Every evening | ORAL | Status: DC | PRN

## 2012-07-31 MED ORDER — KETOROLAC TROMETHAMINE 60 MG/2ML IM SOLN
INTRAMUSCULAR | Status: AC
  Administered 2012-07-31: 60 mg via INTRAMUSCULAR
  Filled 2012-07-31: qty 2

## 2012-07-31 MED ORDER — SCOPOLAMINE 1 MG/3DAYS TD PT72
MEDICATED_PATCH | TRANSDERMAL | Status: AC
  Administered 2012-07-31: 1.5 mg via TRANSDERMAL
  Filled 2012-07-31: qty 1

## 2012-07-31 MED ORDER — LACTATED RINGERS IV SOLN
INTRAVENOUS | Status: DC
  Administered 2012-08-01: via INTRAVENOUS

## 2012-07-31 MED ORDER — DIPHENHYDRAMINE HCL 25 MG PO CAPS: 25.0000 mg | ORAL_CAPSULE | Freq: Four times a day (QID) | ORAL | Status: DC | PRN

## 2012-07-31 MED ORDER — NALBUPHINE HCL 10 MG/ML IJ SOLN
5.0000 mg | INTRAMUSCULAR | Status: DC | PRN
  Administered 2012-07-31 – 2012-08-01 (×2): 10 mg via SUBCUTANEOUS
  Filled 2012-07-31 (×2): qty 1

## 2012-07-31 MED ORDER — DIPHENHYDRAMINE HCL 50 MG/ML IJ SOLN: 12.5000 mg | INTRAMUSCULAR | Status: DC | PRN

## 2012-07-31 MED ORDER — HYDROMORPHONE HCL PF 1 MG/ML IJ SOLN
INTRAMUSCULAR | Status: AC
  Filled 2012-07-31: qty 1

## 2012-07-31 MED ORDER — TETANUS-DIPHTH-ACELL PERTUSSIS 5-2.5-18.5 LF-MCG/0.5 IM SUSP: 0.5000 mL | Freq: Once | INTRAMUSCULAR | Status: DC

## 2012-07-31 MED ORDER — OXYTOCIN 40 UNITS IN LACTATED RINGERS INFUSION - SIMPLE MED: 62.5000 mL/h | INTRAVENOUS | Status: AC

## 2012-07-31 MED ORDER — FENTANYL CITRATE 0.05 MG/ML IJ SOLN
INTRAMUSCULAR | Status: DC | PRN
  Administered 2012-07-31: 62.5 ug via INTRAVENOUS
  Administered 2012-07-31: 12.5 ug via INTRATHECAL

## 2012-07-31 MED ORDER — MORPHINE SULFATE (PF) 0.5 MG/ML IJ SOLN
INTRAMUSCULAR | Status: DC | PRN
  Administered 2012-07-31: .15 mg via INTRATHECAL

## 2012-07-31 MED ORDER — PHENYLEPHRINE HCL 10 MG/ML IJ SOLN
INTRAMUSCULAR | Status: DC | PRN
  Administered 2012-07-31 (×2): 40 ug via INTRAVENOUS
  Administered 2012-07-31: 80 ug via INTRAVENOUS
  Administered 2012-07-31: 40 ug via INTRAVENOUS

## 2012-07-31 MED ORDER — MEPERIDINE HCL 25 MG/ML IJ SOLN: 6.2500 mg | INTRAMUSCULAR | Status: DC | PRN

## 2012-07-31 MED ORDER — EPHEDRINE SULFATE 50 MG/ML IJ SOLN
INTRAMUSCULAR | Status: DC | PRN
  Administered 2012-07-31 (×3): 10 mg via INTRAVENOUS

## 2012-07-31 MED ORDER — OXYTOCIN 10 UNIT/ML IJ SOLN
40.0000 [IU] | INTRAVENOUS | Status: DC | PRN
  Administered 2012-07-31: 40 [IU] via INTRAVENOUS

## 2012-07-31 MED ORDER — KETOROLAC TROMETHAMINE 30 MG/ML IJ SOLN: 30.0000 mg | Freq: Four times a day (QID) | INTRAMUSCULAR | Status: AC | PRN

## 2012-07-31 MED ORDER — DIBUCAINE 1 % RE OINT: 1.0000 "application " | TOPICAL_OINTMENT | RECTAL | Status: DC | PRN

## 2012-07-31 MED ORDER — ONDANSETRON HCL 4 MG/2ML IJ SOLN: 4.0000 mg | Freq: Three times a day (TID) | INTRAMUSCULAR | Status: DC | PRN

## 2012-07-31 MED ORDER — PHENYLEPHRINE 40 MCG/ML (10ML) SYRINGE FOR IV PUSH (FOR BLOOD PRESSURE SUPPORT)
PREFILLED_SYRINGE | INTRAVENOUS | Status: AC
  Filled 2012-07-31: qty 5

## 2012-07-31 MED ORDER — WITCH HAZEL-GLYCERIN EX PADS: 1.0000 "application " | MEDICATED_PAD | CUTANEOUS | Status: DC | PRN

## 2012-07-31 MED ORDER — OXYTOCIN 10 UNIT/ML IJ SOLN
INTRAMUSCULAR | Status: AC
  Filled 2012-07-31: qty 4

## 2012-07-31 MED ORDER — SODIUM CHLORIDE 0.9 % IV SOLN
1.0000 ug/kg/h | INTRAVENOUS | Status: DC | PRN
  Filled 2012-07-31: qty 2.5

## 2012-07-31 MED ORDER — IBUPROFEN 600 MG PO TABS
600.0000 mg | ORAL_TABLET | Freq: Four times a day (QID) | ORAL | Status: DC
  Administered 2012-07-31 – 2012-08-03 (×10): 600 mg via ORAL
  Filled 2012-07-31 (×10): qty 1

## 2012-07-31 MED ORDER — HYDROMORPHONE HCL PF 1 MG/ML IJ SOLN: 1.0000 mg | Freq: Once | INTRAMUSCULAR | Status: DC

## 2012-07-31 MED ORDER — NALOXONE HCL 0.4 MG/ML IJ SOLN: 0.4000 mg | INTRAMUSCULAR | Status: DC | PRN

## 2012-07-31 MED ORDER — BUPIVACAINE IN DEXTROSE 0.75-8.25 % IT SOLN
INTRATHECAL | Status: DC | PRN
  Administered 2012-07-31: 1.4 mg via INTRATHECAL

## 2012-07-31 MED ORDER — SIMETHICONE 80 MG PO CHEW: 80.0000 mg | CHEWABLE_TABLET | ORAL | Status: DC | PRN

## 2012-07-31 MED ORDER — MORPHINE SULFATE 0.5 MG/ML IJ SOLN
INTRAMUSCULAR | Status: AC
  Filled 2012-07-31: qty 10

## 2012-07-31 MED ORDER — DIPHENHYDRAMINE HCL 25 MG PO CAPS
25.0000 mg | ORAL_CAPSULE | ORAL | Status: DC | PRN
  Filled 2012-07-31: qty 1

## 2012-07-31 MED ORDER — POTASSIUM CL IN DEXTROSE 5% 20 MEQ/L IV SOLN: 20.0000 meq | INTRAVENOUS | Status: DC

## 2012-07-31 MED ORDER — ONDANSETRON HCL 4 MG PO TABS: 4.0000 mg | ORAL_TABLET | ORAL | Status: DC | PRN

## 2012-07-31 MED ORDER — KETOROLAC TROMETHAMINE 30 MG/ML IJ SOLN: 15.0000 mg | Freq: Once | INTRAMUSCULAR | Status: DC | PRN

## 2012-07-31 MED ORDER — OXYCODONE-ACETAMINOPHEN 5-325 MG PO TABS
1.0000 | ORAL_TABLET | ORAL | Status: DC | PRN
  Administered 2012-08-01 – 2012-08-03 (×10): 1 via ORAL
  Filled 2012-07-31 (×10): qty 1

## 2012-07-31 MED ORDER — FENTANYL CITRATE 0.05 MG/ML IJ SOLN
INTRAMUSCULAR | Status: AC
  Filled 2012-07-31: qty 2

## 2012-07-31 MED ORDER — DIPHENHYDRAMINE HCL 50 MG/ML IJ SOLN: 25.0000 mg | INTRAMUSCULAR | Status: DC | PRN

## 2012-07-31 MED ORDER — GENTAMICIN SULFATE 40 MG/ML IJ SOLN
INTRAVENOUS | Status: DC
  Filled 2012-07-31: qty 8.34

## 2012-07-31 MED ORDER — ONDANSETRON HCL 4 MG/2ML IJ SOLN
INTRAMUSCULAR | Status: AC
  Filled 2012-07-31: qty 2

## 2012-07-31 MED ORDER — LACTATED RINGERS IV SOLN
INTRAVENOUS | Status: DC
  Administered 2012-07-31: 13:00:00 via INTRAVENOUS

## 2012-07-31 MED ORDER — GENTAMICIN SULFATE 40 MG/ML IJ SOLN
Freq: Once | INTRAVENOUS | Status: AC
  Administered 2012-07-31: 100 mL via INTRAVENOUS
  Filled 2012-07-31: qty 8.34

## 2012-07-31 MED ORDER — KETOROLAC TROMETHAMINE 60 MG/2ML IM SOLN
60.0000 mg | Freq: Once | INTRAMUSCULAR | Status: AC | PRN
  Administered 2012-07-31: 60 mg via INTRAMUSCULAR

## 2012-07-31 MED ORDER — METOCLOPRAMIDE HCL 5 MG/ML IJ SOLN: 10.0000 mg | Freq: Three times a day (TID) | INTRAMUSCULAR | Status: DC | PRN

## 2012-07-31 MED ORDER — SODIUM CHLORIDE 0.9 % IJ SOLN: 3.0000 mL | INTRAMUSCULAR | Status: DC | PRN

## 2012-07-31 MED ORDER — LANOLIN HYDROUS EX OINT: 1.0000 "application " | TOPICAL_OINTMENT | CUTANEOUS | Status: DC | PRN

## 2012-07-31 MED ORDER — NALBUPHINE HCL 10 MG/ML IJ SOLN
5.0000 mg | INTRAMUSCULAR | Status: DC | PRN
  Filled 2012-07-31 (×2): qty 1

## 2012-07-31 SURGICAL SUPPLY — 19 items
ADH SKN CLS APL DERMABOND .7 (GAUZE/BANDAGES/DRESSINGS) ×1
CLOTH BEACON ORANGE TIMEOUT ST (SAFETY) ×2 IMPLANT
DERMABOND ADVANCED (GAUZE/BANDAGES/DRESSINGS) ×1
DERMABOND ADVANCED .7 DNX12 (GAUZE/BANDAGES/DRESSINGS) ×1 IMPLANT
DURAPREP 26ML APPLICATOR (WOUND CARE) ×2 IMPLANT
ELECT REM PT RETURN 9FT ADLT (ELECTROSURGICAL) ×2
ELECTRODE REM PT RTRN 9FT ADLT (ELECTROSURGICAL) ×1 IMPLANT
GLOVE BIO SURGEON STRL SZ 6.5 (GLOVE) ×4 IMPLANT
GOWN PREVENTION PLUS LG XLONG (DISPOSABLE) ×6 IMPLANT
NS IRRIG 1000ML POUR BTL (IV SOLUTION) ×2 IMPLANT
PACK C SECTION WH (CUSTOM PROCEDURE TRAY) ×2 IMPLANT
PAD OB MATERNITY 4.3X12.25 (PERSONAL CARE ITEMS) ×2 IMPLANT
SUT CHROMIC 0 CTX 36 (SUTURE) ×4 IMPLANT
SUT VIC AB 0 CT1 27 (SUTURE) ×6
SUT VIC AB 0 CT1 27XBRD ANBCTR (SUTURE) ×3 IMPLANT
SUT VIC AB 4-0 KS 27 (SUTURE) ×2 IMPLANT
TOWEL OR 17X24 6PK STRL BLUE (TOWEL DISPOSABLE) ×4 IMPLANT
TRAY FOLEY CATH 14FR (SET/KITS/TRAYS/PACK) ×2 IMPLANT
WATER STERILE IRR 1000ML POUR (IV SOLUTION) ×2 IMPLANT

## 2012-07-31 NOTE — Anesthesia Procedure Notes (Signed)
Spinal  Patient location during procedure: OR Start time: 07/31/2012 1:50 PM End time: 07/31/2012 1:52 PM Staffing Anesthesiologist: Sandrea Hughs Performed by: anesthesiologist  Preanesthetic Checklist Completed: patient identified, site marked, surgical consent, pre-op evaluation, timeout performed, IV checked, risks and benefits discussed and monitors and equipment checked Spinal Block Patient position: sitting Prep: DuraPrep Patient monitoring: heart rate, cardiac monitor, continuous pulse ox and blood pressure Approach: midline Location: L3-4 Injection technique: single-shot Needle Needle type: Sprotte  Needle gauge: 24 G Needle length: 9 cm Needle insertion depth: 5 cm Assessment Sensory level: T4

## 2012-07-31 NOTE — Addendum Note (Signed)
Addendum  created 07/31/12 1700 by Orlie Pollen, CRNA   Modules edited:Notes Section

## 2012-07-31 NOTE — Anesthesia Postprocedure Evaluation (Signed)
Anesthesia Post Note  Patient: Alicia Wade  Procedure(s) Performed: Procedure(s) (LRB): CESAREAN SECTION (N/A)  Anesthesia type: Spinal  Patient location: PACU  Post pain: Pain level controlled  Post assessment: Post-op Vital signs reviewed  Last Vitals:  Filed Vitals:   07/31/12 1505  BP:   Pulse: 68  Temp:   Resp: 18    Post vital signs: Reviewed  Level of consciousness: awake  Complications: No apparent anesthesia complications

## 2012-07-31 NOTE — Anesthesia Preprocedure Evaluation (Signed)
Anesthesia Evaluation  Patient identified by MRN, date of birth, ID band Patient awake    Reviewed: Allergy & Precautions, H&P , NPO status , Patient's Chart, lab work & pertinent test results  Airway Mallampati: I TM Distance: >3 FB Neck ROM: full    Dental No notable dental hx.    Pulmonary neg pulmonary ROS,    Pulmonary exam normal       Cardiovascular negative cardio ROS      Neuro/Psych negative neurological ROS  negative psych ROS   GI/Hepatic negative GI ROS, Neg liver ROS,   Endo/Other  negative endocrine ROS  Renal/GU   negative genitourinary   Musculoskeletal negative musculoskeletal ROS (+)   Abdominal Normal abdominal exam  (+)   Peds negative pediatric ROS (+)  Hematology negative hematology ROS (+)   Anesthesia Other Findings   Reproductive/Obstetrics (+) Pregnancy                           Anesthesia Physical Anesthesia Plan  ASA: II  Anesthesia Plan: Spinal   Post-op Pain Management:    Induction:   Airway Management Planned:   Additional Equipment:   Intra-op Plan:   Post-operative Plan:   Informed Consent: I have reviewed the patients History and Physical, chart, labs and discussed the procedure including the risks, benefits and alternatives for the proposed anesthesia with the patient or authorized representative who has indicated his/her understanding and acceptance.     Plan Discussed with: CRNA and Surgeon  Anesthesia Plan Comments:         Anesthesia Quick Evaluation

## 2012-07-31 NOTE — Transfer of Care (Signed)
Immediate Anesthesia Transfer of Care Note  Patient: Alicia Wade  Procedure(s) Performed: Procedure(s) (LRB) with comments: CESAREAN SECTION (N/A)  Patient Location: PACU  Anesthesia Type: Spinal  Level of Consciousness: awake, alert  and oriented  Airway & Oxygen Therapy: Patient Spontanous Breathing  Post-op Assessment: Report given to PACU RN and Post -op Vital signs reviewed and stable  Post vital signs: Reviewed and stable  Complications: No apparent anesthesia complications

## 2012-07-31 NOTE — H&P (Signed)
30 year olg G 4 P 1021 at 37 weeks presents in early labor, breech and previous LTCS. Cervix was long and closed last week. She reports increasing contractions and cervix today 90% 1 + cm -1 Breech. PNC  Previous LTCS GBBS is negative  Afebrile Vital signs stable General alert and oriented Lung CTAB Car RRR Abdomen is soft and non tender Pelvic is above  IMPRESSION: IUP at 37 weeks Previous C Section Early Labor Breech  PLAN: Repeat LTCS

## 2012-07-31 NOTE — Brief Op Note (Signed)
07/31/2012  2:26 PM  PATIENT:  Alicia Wade  31 y.o. female  PRE-OPERATIVE DIAGNOSIS:  Previous Cesarean Section, IUP at 37 weeks, Breech, Early Labor  POST-OPERATIVE DIAGNOSIS:  Same  PROCEDURE:  Procedure(s) (LRB) with comments: CESAREAN SECTION (N/A) Repeat Low Transverse  SURGEON:  Surgeon(s) and Role:    * Jeani Hawking, MD - Primary  PHYSICIAN ASSISTANT:   ASSISTANTS: none   ANESTHESIA:   spinal  EBL:  Total I/O In: 1700 [I.V.:1700] Out: -   BLOOD ADMINISTERED:none  DRAINS: Urinary Catheter (Foley)   LOCAL MEDICATIONS USED:  NONE  SPECIMEN:  No Specimen  DISPOSITION OF SPECIMEN:  N/A  COUNTS:  YES  TOURNIQUET:  * No tourniquets in log *  DICTATION: .Other Dictation: Dictation Number [id number]  PLAN OF CARE: Admit to inpatient   PATIENT DISPOSITION:  PACU - hemodynamically stable.   Delay start of Pharmacological VTE agent (>24hrs) due to surgical blood loss or risk of bleeding: not applicable

## 2012-07-31 NOTE — Anesthesia Postprocedure Evaluation (Signed)
  Anesthesia Post-op Note  Patient: Alicia Wade  Procedure(s) Performed: Procedure(s) (LRB) with comments: CESAREAN SECTION (N/A)  Patient Location: PACU  Anesthesia Type: Spinal  Level of Consciousness: awake, alert , oriented and patient cooperative  Airway and Oxygen Therapy: Patient Spontanous Breathing  Post-op Pain: none  Post-op Assessment: Post-op Vital signs reviewed and Patient's Cardiovascular Status Stable  Post-op Vital Signs: Reviewed and stable  Complications: No apparent anesthesia complications

## 2012-08-01 ENCOUNTER — Encounter (HOSPITAL_COMMUNITY): Payer: Self-pay | Admitting: Obstetrics and Gynecology

## 2012-08-01 LAB — CBC
HCT: 29.6 % — ABNORMAL LOW (ref 36.0–46.0)
Hemoglobin: 10.1 g/dL — ABNORMAL LOW (ref 12.0–15.0)
RDW: 13.3 % (ref 11.5–15.5)
WBC: 10.5 10*3/uL (ref 4.0–10.5)

## 2012-08-01 NOTE — Op Note (Signed)
Alicia Wade, Alicia Wade             ACCOUNT NO.:  [id number]  MEDICAL RECORD NO.:  [id number]  LOCATION:  9137                          FACILITY:  [facility]  PHYSICIAN:  Savien Mamula L. Geneive Sandstrom, M.D.DATE OF BIRTH:  07/26/1981  DATE OF PROCEDURE:  07/31/2012 DATE OF DISCHARGE:                              OPERATIVE REPORT   PREOPERATIVE DIAGNOSES:  Intrauterine pregnancy at 37 weeks, previous cesarean section, breech in labor.  POSTOPERATIVE DIAGNOSES:  Intrauterine pregnancy at 37 weeks, previous cesarean section, breech in labor.  PROCEDURE:  Repeat low-transverse cesarean section.  SURGEON:  Tisa Weisel L. Avani Sensabaugh, MD  ANESTHESIA:  Spinal.  EBL:  Less than 500 mL.  COMPLICATIONS:  None.  PATHOLOGY:  None.  PROCEDURE:  The patient was taken to the operating room.  Her spinal was placed.  She was then prepped and draped.  A Foley catheter was inserted.  A low-transverse incision was made and carried down to the fascia.  Fascia was scored in the midline and extended laterally. Rectus muscles were separated in the midline.  The peritoneum was entered bluntly.  The peritoneal incision was then stretched.  The bladder blade was inserted.  The lower uterine segment was identified and the bladder flap was created sharply and then digitally.  The bladder blade was then readjusted.  A low-transverse incision was made in the uterus.  The uterus was entered using the hemostat.  The baby was in frank breech presentation was delivered quite easily.  The cord was clamped and cut.  The baby was a female infant with Apgars 9 and 9.  The placenta was manually removed, noted to be normal intact with a 3-vessel cord.  Uterus was exteriorized and you could see that it was a bicornuate uterus.  The uterus was carefully cleaned of all clots and debris.  The uterine incision was closed in 1 layer using 0 chromic.  It was returned to the abdomen.  Irrigation was performed.  Hemostasis was excellent.  The  peritoneum and rectus muscles were reapproximated using 0 Vicryl.  The fascia was closed using 0 Vicryl running stitch, starting each corner meeting in the midline. After irrigation of subcutaneous layer, the skin was closed with a 4-0 Vicryl on a Keith needle.  Dermabond was applied.  All sponge, lap, and instrument counts were correct x2.  The patient went to the recovery room in stable condition.     Nikya Busler L. Vincente Poli, M.D.     [name]  D:  07/31/2012  T:  08/01/2012  Job:  [id number]

## 2012-08-01 NOTE — Progress Notes (Signed)
Subjective: Postpartum Day 1: Cesarean Delivery Patient reports tolerating PO and no problems voiding.    Objective: Vital signs in last 24 hours: Temp:  [97.6 F (36.4 C)-98.7 F (37.1 C)] 97.8 F (36.6 C) (10/15 0510) Pulse Rate:  [55-74] 67  (10/15 0510) Resp:  [16-20] 20  (10/15 0510) BP: (98-125)/(52-77) 107/68 mmHg (10/15 0510) SpO2:  [99 %-100 %] 100 % (10/15 0102) Weight:  [66.679 kg (147 lb)] 66.679 kg (147 lb) (10/14 1248)  Physical Exam:  General: alert and cooperative Lochia: appropriate Uterine Fundus: firm Incision: healing well DVT Evaluation: No evidence of DVT seen on physical exam. No significant calf/ankle edema.   Basename 08/01/12 0540 07/31/12 1207  HGB 10.1* 11.4*  HCT 29.6* 34.1*    Assessment/Plan: Status post Cesarean section. Doing well postoperatively.  Continue current care.  Cylee Dattilo G 08/01/2012, 8:16 AM

## 2012-08-02 LAB — BIRTH TISSUE RECOVERY COLLECTION (PLACENTA DONATION)

## 2012-08-02 NOTE — Progress Notes (Signed)
Subjective: Postpartum Day 2: Cesarean Delivery Patient reports tolerating PO, + flatus and no problems voiding.  Baby with low temp, may desire late discharge  Objective: Vital signs in last 24 hours: Temp:  [97.5 F (36.4 C)-98.4 F (36.9 C)] 98.4 F (36.9 C) (10/15 2205) Pulse Rate:  [66-75] 72  (10/15 2205) Resp:  [16-18] 16  (10/15 2205) BP: (92-108)/(69-70) 108/69 mmHg (10/15 2205) SpO2:  [97 %-99 %] 97 % (10/15 2205)  Physical Exam:  General: alert and cooperative Lochia: appropriate Uterine Fundus: firm Incision: healing well DVT Evaluation: No evidence of DVT seen on physical exam. No significant calf/ankle edema.   Basename 08/01/12 0540 07/31/12 1207  HGB 10.1* 11.4*  HCT 29.6* 34.1*    Assessment/Plan: Status post Cesarean section. Doing well postoperatively.  Continue current care.  CURTIS,CAROL G 08/02/2012, 8:47 AM

## 2012-08-03 MED ORDER — OXYCODONE-ACETAMINOPHEN 5-325 MG PO TABS: 1.0000 | ORAL_TABLET | ORAL | Status: DC | PRN

## 2012-08-03 MED ORDER — NITROFURANTOIN MONOHYD MACRO 100 MG PO CAPS
100.0000 mg | ORAL_CAPSULE | Freq: Two times a day (BID) | ORAL | Status: DC
  Administered 2012-08-03: 100 mg via ORAL
  Filled 2012-08-03 (×3): qty 1

## 2012-08-03 MED ORDER — NITROFURANTOIN MONOHYD MACRO 100 MG PO CAPS: 100.0000 mg | ORAL_CAPSULE | Freq: Two times a day (BID) | ORAL | Status: DC

## 2012-08-03 MED ORDER — IBUPROFEN 600 MG PO TABS: 600.0000 mg | ORAL_TABLET | Freq: Four times a day (QID) | ORAL | Status: DC

## 2012-08-03 NOTE — Discharge Summary (Signed)
Obstetric Discharge Summary Reason for Admission: cesarean section Prenatal Procedures: ultrasound Intrapartum Procedures: cesarean: low cervical, transverse Postpartum Procedures: none Complications-Operative and Postpartum: none Hemoglobin  Date Value Range Status  08/01/2012 10.1* 12.0 - 15.0 g/dL Final     HCT  Date Value Range Status  08/01/2012 29.6* 36.0 - 46.0 % Final    Physical Exam:  General: alert and cooperative Lochia: appropriate Uterine Fundus: firm Incision: healing well DVT Evaluation: No evidence of DVT seen on physical exam. Negative Homan's sign. No cords or calf tenderness. No significant calf/ankle edema. C/o dysuria and urgency Discharge Diagnoses: Term Pregnancy-delivered  Discharge Information: Date: 08/03/2012 Activity: pelvic rest Diet: routine Medications: PNV, Ibuprofen, Percocet and macrobid Condition: stable Instructions: refer to practice specific booklet Discharge to: home   Newborn Data: Live born female  Birth Weight: 6 lb 5.2 oz (2870 g) APGAR: 9, 9  Home with mother.  Alicia Wade G 08/03/2012, 8:50 AM

## 2012-08-07 ENCOUNTER — Other Ambulatory Visit (HOSPITAL_COMMUNITY): Payer: BC Managed Care – PPO

## 2012-09-20 ENCOUNTER — Other Ambulatory Visit: Payer: Self-pay | Admitting: Obstetrics and Gynecology

## 2013-06-18 DIAGNOSIS — Z969 Presence of functional implant, unspecified: Secondary | ICD-10-CM

## 2013-06-18 HISTORY — DX: Presence of functional implant, unspecified: Z96.9

## 2013-07-05 ENCOUNTER — Encounter (HOSPITAL_BASED_OUTPATIENT_CLINIC_OR_DEPARTMENT_OTHER): Payer: Self-pay | Admitting: *Deleted

## 2013-07-06 ENCOUNTER — Ambulatory Visit (HOSPITAL_BASED_OUTPATIENT_CLINIC_OR_DEPARTMENT_OTHER): Payer: BC Managed Care – PPO | Admitting: Anesthesiology

## 2013-07-06 ENCOUNTER — Encounter (HOSPITAL_BASED_OUTPATIENT_CLINIC_OR_DEPARTMENT_OTHER)
Admission: RE | Disposition: A | Discharge status: Home / Self Care | Payer: Self-pay | Source: Ambulatory Visit | Attending: Orthopedic Surgery

## 2013-07-06 ENCOUNTER — Ambulatory Visit (HOSPITAL_BASED_OUTPATIENT_CLINIC_OR_DEPARTMENT_OTHER)
Admission: RE | Admit: 2013-07-06 | Discharge: 2013-07-06 | Disposition: A | Discharge status: Home / Self Care | Payer: BC Managed Care – PPO | Source: Ambulatory Visit | Attending: Orthopedic Surgery | Admitting: Orthopedic Surgery

## 2013-07-06 ENCOUNTER — Encounter (HOSPITAL_BASED_OUTPATIENT_CLINIC_OR_DEPARTMENT_OTHER): Payer: Self-pay | Admitting: Anesthesiology

## 2013-07-06 DIAGNOSIS — Y831 Surgical operation with implant of artificial internal device as the cause of abnormal reaction of the patient, or of later complication, without mention of misadventure at the time of the procedure: Secondary | ICD-10-CM | POA: Insufficient documentation

## 2013-07-06 DIAGNOSIS — T8489XA Other specified complication of internal orthopedic prosthetic devices, implants and grafts, initial encounter: Secondary | ICD-10-CM | POA: Insufficient documentation

## 2013-07-06 HISTORY — DX: Presence of functional implant, unspecified: Z96.9

## 2013-07-06 HISTORY — PX: HARDWARE REMOVAL: SHX979

## 2013-07-06 HISTORY — DX: Personal history of urinary calculi: Z87.442

## 2013-07-06 HISTORY — DX: Personal history of diseases of the blood and blood-forming organs and certain disorders involving the immune mechanism: Z86.2

## 2013-07-06 HISTORY — DX: Gastritis, unspecified, without bleeding: K29.70

## 2013-07-06 HISTORY — DX: Personal history of diseases of the skin and subcutaneous tissue: Z87.2

## 2013-07-06 SURGERY — REMOVAL, HARDWARE
Anesthesia: General | Site: Foot | Laterality: Left | Wound class: Clean

## 2013-07-06 MED ORDER — ONDANSETRON HCL 4 MG/2ML IJ SOLN
INTRAMUSCULAR | Status: DC | PRN
  Administered 2013-07-06: 4 mg via INTRAVENOUS

## 2013-07-06 MED ORDER — MIDAZOLAM HCL 2 MG/2ML IJ SOLN: 1.0000 mg | INTRAMUSCULAR | Status: DC | PRN

## 2013-07-06 MED ORDER — OXYCODONE HCL 5 MG/5ML PO SOLN: 5.0000 mg | Freq: Once | ORAL | Status: DC | PRN

## 2013-07-06 MED ORDER — LIDOCAINE HCL (CARDIAC) 20 MG/ML IV SOLN
INTRAVENOUS | Status: DC | PRN
  Administered 2013-07-06: 50 mg via INTRAVENOUS

## 2013-07-06 MED ORDER — OXYCODONE HCL 5 MG PO TABS: 5.0000 mg | ORAL_TABLET | Freq: Once | ORAL | Status: DC | PRN

## 2013-07-06 MED ORDER — CHLORHEXIDINE GLUCONATE 4 % EX LIQD: 60.0000 mL | Freq: Once | CUTANEOUS | Status: DC

## 2013-07-06 MED ORDER — LACTATED RINGERS IV SOLN
INTRAVENOUS | Status: DC
  Administered 2013-07-06 (×2): via INTRAVENOUS

## 2013-07-06 MED ORDER — ONDANSETRON HCL 4 MG/2ML IJ SOLN: 4.0000 mg | Freq: Once | INTRAMUSCULAR | Status: DC | PRN

## 2013-07-06 MED ORDER — BUPIVACAINE HCL (PF) 0.5 % IJ SOLN
INTRAMUSCULAR | Status: DC | PRN
  Administered 2013-07-06: 10 mL

## 2013-07-06 MED ORDER — DEXAMETHASONE SODIUM PHOSPHATE 4 MG/ML IJ SOLN
INTRAMUSCULAR | Status: DC | PRN
  Administered 2013-07-06: 5 mg via INTRAVENOUS

## 2013-07-06 MED ORDER — TRAMADOL HCL 50 MG PO TABS: 50.0000 mg | ORAL_TABLET | Freq: Four times a day (QID) | ORAL | Status: AC | PRN

## 2013-07-06 MED ORDER — PROPOFOL 10 MG/ML IV BOLUS
INTRAVENOUS | Status: DC | PRN
  Administered 2013-07-06: 200 mg via INTRAVENOUS

## 2013-07-06 MED ORDER — DIPHENHYDRAMINE HCL 50 MG/ML IJ SOLN
12.5000 mg | Freq: Once | INTRAMUSCULAR | Status: AC
  Administered 2013-07-06: 12.5 mg via INTRAVENOUS

## 2013-07-06 MED ORDER — ACETAMINOPHEN 500 MG PO TABS: 1000.0000 mg | ORAL_TABLET | Freq: Once | ORAL | Status: DC

## 2013-07-06 MED ORDER — MIDAZOLAM HCL 2 MG/ML PO SYRP: 12.0000 mg | ORAL_SOLUTION | Freq: Once | ORAL | Status: DC | PRN

## 2013-07-06 MED ORDER — FENTANYL CITRATE 0.05 MG/ML IJ SOLN: 50.0000 ug | INTRAMUSCULAR | Status: DC | PRN

## 2013-07-06 MED ORDER — CEFAZOLIN SODIUM-DEXTROSE 2-3 GM-% IV SOLR
2.0000 g | Freq: Once | INTRAVENOUS | Status: AC
  Administered 2013-07-06: 2 g via INTRAVENOUS

## 2013-07-06 MED ORDER — HYDROMORPHONE HCL PF 1 MG/ML IJ SOLN
0.2500 mg | INTRAMUSCULAR | Status: DC | PRN
  Administered 2013-07-06 (×3): 0.25 mg via INTRAVENOUS

## 2013-07-06 MED ORDER — MIDAZOLAM HCL 5 MG/5ML IJ SOLN
INTRAMUSCULAR | Status: DC | PRN
  Administered 2013-07-06: 2 mg via INTRAVENOUS

## 2013-07-06 MED ORDER — FENTANYL CITRATE 0.05 MG/ML IJ SOLN
INTRAMUSCULAR | Status: DC | PRN
  Administered 2013-07-06: 100 ug via INTRAVENOUS

## 2013-07-06 SURGICAL SUPPLY — 56 items
BANDAGE ELASTIC 4 VELCRO ST LF (GAUZE/BANDAGES/DRESSINGS) ×2 IMPLANT
BENZOIN TINCTURE PRP APPL 2/3 (GAUZE/BANDAGES/DRESSINGS) ×2 IMPLANT
BLADE SURG 15 STRL LF DISP TIS (BLADE) ×1 IMPLANT
BLADE SURG 15 STRL SS (BLADE) ×1
BNDG COHESIVE 4X5 TAN STRL (GAUZE/BANDAGES/DRESSINGS) ×2 IMPLANT
BNDG ESMARK 4X9 LF (GAUZE/BANDAGES/DRESSINGS) ×2 IMPLANT
CHLORAPREP W/TINT 26ML (MISCELLANEOUS) ×2 IMPLANT
CLOTH BEACON ORANGE TIMEOUT ST (SAFETY) ×2 IMPLANT
COVER TABLE BACK 60X90 (DRAPES) ×2 IMPLANT
CUFF TOURNIQUET SINGLE 24IN (TOURNIQUET CUFF) ×2 IMPLANT
CUFF TOURNIQUET SINGLE 34IN LL (TOURNIQUET CUFF) IMPLANT
DECANTER SPIKE VIAL GLASS SM (MISCELLANEOUS) IMPLANT
DRAPE EXTREMITY T 121X128X90 (DRAPE) ×2 IMPLANT
DRAPE OEC MINIVIEW 54X84 (DRAPES) ×2 IMPLANT
DRAPE SURG 17X23 STRL (DRAPES) IMPLANT
DRAPE U 20/CS (DRAPES) ×2 IMPLANT
DRAPE U-SHAPE 47X51 STRL (DRAPES) IMPLANT
DRSG EMULSION OIL 3X3 NADH (GAUZE/BANDAGES/DRESSINGS) ×2 IMPLANT
ELECT REM PT RETURN 9FT ADLT (ELECTROSURGICAL) ×2
ELECTRODE REM PT RTRN 9FT ADLT (ELECTROSURGICAL) ×1 IMPLANT
GAUZE XEROFORM 1X8 LF (GAUZE/BANDAGES/DRESSINGS) IMPLANT
GLOVE BIO SURGEON STRL SZ7.5 (GLOVE) ×4 IMPLANT
GLOVE BIOGEL PI IND STRL 7.0 (GLOVE) ×2 IMPLANT
GLOVE BIOGEL PI IND STRL 8 (GLOVE) ×1 IMPLANT
GLOVE BIOGEL PI INDICATOR 7.0 (GLOVE) ×2
GLOVE BIOGEL PI INDICATOR 8 (GLOVE) ×1
GLOVE ECLIPSE 6.5 STRL STRAW (GLOVE) ×2 IMPLANT
GOWN PREVENTION PLUS XLARGE (GOWN DISPOSABLE) ×4 IMPLANT
NEEDLE HYPO 25X1 1.5 SAFETY (NEEDLE) ×2 IMPLANT
NS IRRIG 1000ML POUR BTL (IV SOLUTION) ×2 IMPLANT
PACK BASIN DAY SURGERY FS (CUSTOM PROCEDURE TRAY) ×2 IMPLANT
PAD CAST 4YDX4 CTTN HI CHSV (CAST SUPPLIES) ×1 IMPLANT
PADDING CAST ABS 4INX4YD NS (CAST SUPPLIES) ×1
PADDING CAST ABS COTTON 4X4 ST (CAST SUPPLIES) ×1 IMPLANT
PADDING CAST COTTON 4X4 STRL (CAST SUPPLIES) ×2
PENCIL BUTTON HOLSTER BLD 10FT (ELECTRODE) ×2 IMPLANT
SHEET MEDIUM DRAPE 40X70 STRL (DRAPES) IMPLANT
SLEEVE SCD COMPRESS KNEE MED (MISCELLANEOUS) IMPLANT
SPONGE GAUZE 4X4 12PLY (GAUZE/BANDAGES/DRESSINGS) ×2 IMPLANT
SPONGE LAP 4X18 X RAY DECT (DISPOSABLE) ×2 IMPLANT
STOCKINETTE 4X48 STRL (DRAPES) IMPLANT
STOCKINETTE 6  STRL (DRAPES) ×1
STOCKINETTE 6 STRL (DRAPES) ×1 IMPLANT
STRIP CLOSURE SKIN 1/2X4 (GAUZE/BANDAGES/DRESSINGS) ×2 IMPLANT
SUCTION FRAZIER TIP 10 FR DISP (SUCTIONS) IMPLANT
SUT ETHILON 3 0 PS 1 (SUTURE) IMPLANT
SUT MON AB 4-0 PC3 18 (SUTURE) IMPLANT
SUT PROLENE 3 0 PS 2 (SUTURE) ×2 IMPLANT
SUT VIC AB 2-0 SH 27 (SUTURE)
SUT VIC AB 2-0 SH 27XBRD (SUTURE) IMPLANT
SUT VIC AB 3-0 FS2 27 (SUTURE) IMPLANT
SYR BULB 3OZ (MISCELLANEOUS) ×2 IMPLANT
SYR CONTROL 10ML LL (SYRINGE) ×2 IMPLANT
TOWEL OR 17X24 6PK STRL BLUE (TOWEL DISPOSABLE) ×2 IMPLANT
TUBE CONNECTING 20X1/4 (TUBING) IMPLANT
UNDERPAD 30X30 INCONTINENT (UNDERPADS AND DIAPERS) ×2 IMPLANT

## 2013-07-06 NOTE — Anesthesia Preprocedure Evaluation (Signed)
Anesthesia Evaluation  Patient identified by MRN, date of birth, ID band Patient awake    Reviewed: Allergy & Precautions, H&P , NPO status , Patient's Chart, lab work & pertinent test results  Airway Mallampati: I TM Distance: >3 FB Neck ROM: Full    Dental  (+) Teeth Intact and Dental Advisory Given   Pulmonary  breath sounds clear to auscultation        Cardiovascular Rhythm:Regular Rate:Normal     Neuro/Psych    GI/Hepatic   Endo/Other    Renal/GU      Musculoskeletal   Abdominal   Peds  Hematology   Anesthesia Other Findings Breast feeding  Reproductive/Obstetrics                           Anesthesia Physical Anesthesia Plan  ASA: I  Anesthesia Plan: General   Post-op Pain Management:    Induction: Intravenous  Airway Management Planned: LMA  Additional Equipment:   Intra-op Plan:   Post-operative Plan: Extubation in OR  Informed Consent: I have reviewed the patients History and Physical, chart, labs and discussed the procedure including the risks, benefits and alternatives for the proposed anesthesia with the patient or authorized representative who has indicated his/her understanding and acceptance.   Dental advisory given  Plan Discussed with: CRNA, Anesthesiologist and Surgeon  Anesthesia Plan Comments: (Pt understands that I am advising her not to breast feed for 24 hours after her surgery.)        Anesthesia Quick Evaluation

## 2013-07-06 NOTE — Anesthesia Postprocedure Evaluation (Signed)
  Anesthesia Post-op Note  Patient: Alicia Wade  Procedure(s) Performed: Procedure(s): HARDWARE REMOVAL:LEFT FOOT (Left)  Patient Location: PACU  Anesthesia Type:General  Level of Consciousness: awake, alert  and oriented  Airway and Oxygen Therapy: Patient Spontanous Breathing  Post-op Pain: mild  Post-op Assessment: Post-op Vital signs reviewed  Post-op Vital Signs: Reviewed  Complications: No apparent anesthesia complications

## 2013-07-06 NOTE — Op Note (Signed)
07/06/2013  1:44 PM  PATIENT:  Alicia Wade    PRE-OPERATIVE DIAGNOSIS:  left foot retained hardware  POST-OPERATIVE DIAGNOSIS:  Same  PROCEDURE:  HARDWARE REMOVAL:LEFT FOOT  SURGEON:  Monisha Siebel, D, MD  ASSISTANT: None  ANESTHESIA:   Gen  PREOPERATIVE INDICATIONS:  Alicia Wade is a  32 y.o. female with a diagnosis of left foot retained hardware who failed conservative measures and elected for surgical management.    The risks benefits and alternatives were discussed with the patient preoperatively including but not limited to the risks of infection, bleeding, nerve injury, cardiopulmonary complications, the need for revision surgery, among others, and the patient was willing to proceed.  OPERATIVE IMPLANTS: None  OPERATIVE FINDINGS: Removal of all hardware  BLOOD LOSS: min  COMPLICATIONS: None  TOURNIQUET TIME: 15  OPERATIVE PROCEDURE:  Patient was identified in the preoperative holding area and site was marked by me He was transported to the operating theater and placed on the table in supine position taking care to pad all bony prominences. After a preincinduction time out anesthesia was induced. The Left lower extremity was prepped and draped in normal sterile fashion and a pre-incision timeout was performed. Alicia Wade received Ancef for preoperative antibiotics. I made a 1 cm incision over the proximal prominent screw head. Dissected down sharply to the screw head and used a small frag screwdriver to remove it completely and came out intact. I then made a second distal 1 cm incision through her previous scar and dissected down sharply to her prominent screw head and remove this one completely as well. The extensor tendon was mobilized and moved medially to gain access to the distal screw head. Final fluoroscopic views demonstrated complete removal of all hardware. I closed the wound using nylon stitches after thorough irrigation. I placed a sterile dressing  and placed her in a boot and she was awoken and taken the PACU in stable condition.  POST OPERATIVE PLAN: She'll ambulate for DVT prophylaxis she'll be weightbearing as tolerated. She was given a prescription of Ultram.

## 2013-07-06 NOTE — Transfer of Care (Signed)
Immediate Anesthesia Transfer of Care Note  Patient: Alicia Wade  Procedure(s) Performed: Procedure(s): HARDWARE REMOVAL:LEFT FOOT (Left)  Patient Location: PACU  Anesthesia Type:General  Level of Consciousness: awake  Airway & Oxygen Therapy: Patient Spontanous Breathing and Patient connected to face mask oxygen  Post-op Assessment: Report given to PACU RN and Post -op Vital signs reviewed and stable  Post vital signs: Reviewed and stable  Complications: No apparent anesthesia complications

## 2013-07-06 NOTE — Interval H&P Note (Signed)
History and Physical Interval Note:  07/06/2013 8:38 AM  Alicia Wade  has presented today for surgery, with the diagnosis of Left Foot: Mechanical complications  The various methods of treatment have been discussed with the patient and family. After consideration of risks, benefits and other options for treatment, the patient has consented to  Procedure(s): HARDWARE REMOVAL:LEFT FOOT (Left) as a surgical intervention .  The patient's history has been reviewed, patient examined, no change in status, stable for surgery.  I have reviewed the patient's chart and labs.  Questions were answered to the patient's satisfaction.     Suly Vukelich, D

## 2013-07-06 NOTE — Anesthesia Procedure Notes (Signed)
Procedure Name: LMA Insertion Date/Time: 07/06/2013 1:03 PM Performed by: Zenia Resides D Pre-anesthesia Checklist: Patient identified, Emergency Drugs available, Suction available and Patient being monitored Patient Re-evaluated:Patient Re-evaluated prior to inductionOxygen Delivery Method: Circle System Utilized Preoxygenation: Pre-oxygenation with 100% oxygen Intubation Type: IV induction Ventilation: Mask ventilation without difficulty LMA: LMA inserted LMA Size: 4.0 Number of attempts: 1 Airway Equipment and Method: bite block Placement Confirmation: positive ETCO2 Tube secured with: Tape Dental Injury: Teeth and Oropharynx as per pre-operative assessment

## 2013-07-06 NOTE — H&P (Signed)
      ORTHOPAEDIC CONSULTATION  REQUESTING PHYSICIAN: Sheral Apley, MD  Chief Complaint: Painful hardware L foot  HPI: Alicia Wade is a 32 y.o. female who complains of  Painful hardware in her foot  Past Medical History  Diagnosis Date  . Gastritis   . History of kidney stones   . Retained orthopedic hardware 06/2013    left foot  . History of cellulitis     right foot - will finish antibiotic 07/05/2013  . History of anemia     during her pregnancy  . Lactating mother 06/2013   Past Surgical History  Procedure Laterality Date  . Cesarean section  07/02/2010  . Wisdom tooth extraction    . Foot surgery Left   . Dilation and evacuation  10/06/2007; 10/09/2007  . Cesarean section  07/31/2012    Procedure: CESAREAN SECTION;  Surgeon: Jeani Hawking, MD;  Location: [facility];  Service: Obstetrics;  Laterality: N/A;   History   Social History  . Marital Status: Married    Spouse Name: N/A    Number of Children: N/A  . Years of Education: N/A   Social History Main Topics  . Smoking status: Never Smoker   . Smokeless tobacco: Never Used  . Alcohol Use: No  . Drug Use: No  . Sexual Activity: Yes    Birth Control/ Protection: None   Other Topics Concern  . None   Social History Narrative  . None   Family History  Problem Relation Age of Onset  . Anesthesia problems Mother     hyperventilates/panic attack   Allergies  Allergen Reactions  . Augmentin [Amoxicillin-Pot Clavulanate] Diarrhea and Nausea And Vomiting  . Penicillins Hives  . Zithromax [Azithromycin] Diarrhea and Nausea And Vomiting   Prior to Admission medications   Medication Sig Start Date End Date Taking? Authorizing Provider  pantoprazole (PROTONIX) 40 MG tablet Take 40 mg by mouth daily.   Yes Historical Provider, MD  pantoprazole (PROTONIX) 40 MG tablet Take 1 tablet (40 mg total) by mouth daily at 12 noon. 04/27/12 04/27/13  Judith Blonder, NP   No results found.  Positive ROS: All  other systems have been reviewed and were otherwise negative with the exception of those mentioned in the HPI and as above.  Labs cbc No results found for this basename: WBC, HGB, HCT, PLT,  in the last 72 hours  Labs inflam No results found for this basename: ESR, CRP,  in the last 72 hours  Labs coag No results found for this basename: INR, PT, PTT,  in the last 72 hours  No results found for this basename: NA, K, CL, CO2, GLUCOSE, BUN, CREATININE, CALCIUM,  in the last 72 hours  Physical Exam: There were no vitals filed for this visit. General: Alert, no acute distress Cardiovascular: No pedal edema Respiratory: No cyanosis, no use of accessory musculature GI: No organomegaly, abdomen is soft and non-tender Skin: No lesions in the area of chief complaint Neurologic: Sensation intact distally Psychiatric: Patient is competent for consent with normal mood and affect Lymphatic: No axillary or cervical lymphadenopathy  MUSCULOSKELETAL:  TTP over prominent screw SILT DP/SP/S/S/T nerve, 2+ DP, +TA/GS/EHL Compartments soft Painless ROM No Crepitous  Other extremities are atraumatic with painless ROM and NVI.  Assessment: Painful HW  Plan: Hardware removal Weight Bearing Status: Bobby Rumpf, D, MD Cell [phone]   07/06/2013 8:32 AM

## 2013-07-09 ENCOUNTER — Encounter (HOSPITAL_BASED_OUTPATIENT_CLINIC_OR_DEPARTMENT_OTHER): Payer: Self-pay | Admitting: Orthopedic Surgery

## 2013-08-23 ENCOUNTER — Other Ambulatory Visit: Payer: Self-pay

## 2013-11-15 ENCOUNTER — Other Ambulatory Visit: Payer: Self-pay | Admitting: Obstetrics and Gynecology

## 2014-08-19 ENCOUNTER — Encounter (HOSPITAL_BASED_OUTPATIENT_CLINIC_OR_DEPARTMENT_OTHER): Payer: Self-pay | Admitting: Orthopedic Surgery

## 2014-12-17 ENCOUNTER — Other Ambulatory Visit: Payer: Self-pay | Admitting: Obstetrics and Gynecology

## 2014-12-18 LAB — CYTOLOGY - PAP

## 2017-02-21 ENCOUNTER — Ambulatory Visit (INDEPENDENT_AMBULATORY_CARE_PROVIDER_SITE_OTHER): Payer: Self-pay | Admitting: Otolaryngology

## 2018-04-10 ENCOUNTER — Telehealth: Payer: Self-pay | Admitting: Neurology

## 2018-04-10 ENCOUNTER — Encounter: Payer: Self-pay | Admitting: Neurology

## 2018-04-10 ENCOUNTER — Ambulatory Visit: Payer: Medicaid Other | Admitting: Neurology

## 2018-04-10 ENCOUNTER — Other Ambulatory Visit: Payer: Self-pay

## 2018-04-10 ENCOUNTER — Other Ambulatory Visit: Payer: Self-pay | Admitting: *Deleted

## 2018-04-10 VITALS — BP 117/78 | HR 75 | Resp 16 | Ht 65.0 in | Wt 119.0 lb

## 2018-04-10 DIAGNOSIS — R519 Headache, unspecified: Secondary | ICD-10-CM | POA: Insufficient documentation

## 2018-04-10 DIAGNOSIS — R439 Unspecified disturbances of smell and taste: Secondary | ICD-10-CM

## 2018-04-10 DIAGNOSIS — R51 Headache: Secondary | ICD-10-CM

## 2018-04-10 MED ORDER — VENLAFAXINE HCL ER 37.5 MG PO CP24: 37.5000 mg | ORAL_CAPSULE | Freq: Every day | ORAL | 11 refills | Status: DC

## 2018-04-10 MED ORDER — ONDANSETRON 4 MG PO TBDP: 4.0000 mg | ORAL_TABLET | Freq: Three times a day (TID) | ORAL | 6 refills | Status: DC | PRN

## 2018-04-10 MED ORDER — FREMANEZUMAB-VFRM 225 MG/1.5ML ~~LOC~~ SOSY: 225.0000 mg | PREFILLED_SYRINGE | SUBCUTANEOUS | 11 refills | Status: DC | PRN

## 2018-04-10 MED ORDER — RIZATRIPTAN BENZOATE 10 MG PO TBDP: 10.0000 mg | ORAL_TABLET | ORAL | 6 refills | Status: DC | PRN

## 2018-04-10 NOTE — Telephone Encounter (Signed)
[facility] [place] advising PA is needed for rizatriptan (MAXALT-MLT) 10 MG disintegrating tablet.

## 2018-04-10 NOTE — Telephone Encounter (Signed)
Per [place] Medicaid - this is a preferred medication and it does not require a PA (coverage allows #12 per 30 days).  Patient cannot have received another triptan prescription in the last month.  Otherwise, the pharmacy will have to contact the [facility] help desk for an override.  I left a detailed message on the pharmacy's secured provider line, along with our office phone number.

## 2018-04-10 NOTE — Telephone Encounter (Signed)
Medicaid order sent to [facility]. They obtain the auth and will reach out to the pt to schedule.  °

## 2018-04-10 NOTE — Progress Notes (Signed)
PATIENT: Alicia Wade DOB: [birth year]  Chief Complaint  Patient presents with  . Headache    Rm. 4  PCP: Donzetta Sprung.  [name] is here for eval of h/a's onset a few yrs. ago. Weather and menstruation are triggers.  Prior to pregnancy, she had good relief of migraines Topamax. H/A's worse over the last couple of mos. and are associated with facial and hand numbness, phantom smells.  Is currently on Topamax 100mg  bid and prn Imitrex.  Can't tolerate Amitriptylline. Sts. was on a BP med, can't recall the name, but it was ineffective./fim     HISTORICAL  Alicia Wade is a 37 year old female, seen in refer by her primary care physician Dr. Richardean Chimera for evaluation of headache, initial evaluation was on April 10, 2018.  She reported history of migraine headaches since age 32, her typical migraine are lateralized severe pounding headache with associated light noise sensitivity, she was treated with Topamax as preventive medication, which has been helpful,  Going stress of divorce since 2015, she began to have frequent headaches, she also reported history of chronic sinusitis, often preceding her severe headaches she would have strong metallic taste in her mouth, seems only involving one side of the nostril,  Since April 2019 she began to have more frequent headaches, almost daily basis, it can last for days, sometimes woke up in the morning with severe headaches,  Previously she has tried propanolol as migraine prevention did not help, amitriptyline caused significant side effect,  She had somewhat response to Imitrex as abortive treatment, but seems not much better than Excedrin Migraine.  REVIEW OF SYSTEMS: Full 14 system review of systems performed and notable only for as above  ALLERGIES: Allergies  Allergen Reactions  . Augmentin [Amoxicillin-Pot Clavulanate] Diarrhea and Nausea And Vomiting  . Penicillins Hives  . Zithromax [Azithromycin] Diarrhea and Nausea And  Vomiting    HOME MEDICATIONS: Current Outpatient Medications  Medication Sig Dispense Refill  . ketoconazole (NIZORAL) 2 % shampoo Use in scalp and face wash in shower, try to leave on for a few minutes then wash out, few times weekly    . Levonorgestrel-Ethinyl Estradiol (AMETHIA,CAMRESE) 0.1-0.02 & 0.01 MG tablet Take by mouth.    . Milnacipran (SAVELLA) 50 MG TABS tablet TAKE 1 TABLET BY MOUTH TWICE DAILY FOR FIBROMYALGIA    . pantoprazole (PROTONIX) 40 MG tablet Take 40 mg by mouth daily.    . SUMAtriptan (IMITREX) 100 MG tablet TAKE 1 TABLET BY MOUTH AS NEEDED FOR MIGRAINE HEADACHES    . tiZANidine (ZANAFLEX) 4 MG tablet Take by mouth.    . topiramate (TOPAMAX) 100 MG tablet TAKE 1 TABLET BY MOUTH TWICE A DAY    . traMADol (ULTRAM) 50 MG tablet Take 1 tablet (50 mg total) by mouth every 6 (six) hours as needed for pain. 40 tablet 0   No current facility-administered medications for this visit.     PAST MEDICAL HISTORY: Past Medical History:  Diagnosis Date  . Gastritis   . History of anemia    during her pregnancy  . History of cellulitis    right foot - will finish antibiotic 07/05/2013  . History of kidney stones   . Lactating mother 06/2013  . Retained orthopedic hardware 06/2013   left foot    PAST SURGICAL HISTORY: Past Surgical History:  Procedure Laterality Date  . CESAREAN SECTION  07/02/2010  . CESAREAN SECTION  07/31/2012   Procedure: CESAREAN SECTION;  Surgeon: Stann Mainland  Vincente PoliGrewal, MD;  Location: [facility];  Service: Obstetrics;  Laterality: N/A;  . DILATION AND EVACUATION  10/06/2007; 10/09/2007  . FOOT SURGERY Left   . HARDWARE REMOVAL Left 07/06/2013   Procedure: HARDWARE REMOVAL:LEFT FOOT;  Surgeon: Sheral Apleyimothy D Murphy, MD;  Location: [facility];  Service: Orthopedics;  Laterality: Left;  . WISDOM TOOTH EXTRACTION      FAMILY HISTORY: Family History  Problem Relation Age of Onset  . Anesthesia problems Mother        hyperventilates/panic attack    . Other Father        hit by car  . Migraines Sister   . Migraines Brother     SOCIAL HISTORY:  Social History   Socioeconomic History  . Marital status: Married    Spouse name: Not on file  . Number of children: Not on file  . Years of education: Not on file  . Highest education level: Not on file  Occupational History  . Not on file  Social Needs  . Financial resource strain: Not on file  . Food insecurity:    Worry: Not on file    Inability: Not on file  . Transportation needs:    Medical: Not on file    Non-medical: Not on file  Tobacco Use  . Smoking status: Never Smoker  . Smokeless tobacco: Never Used  Substance and Sexual Activity  . Alcohol use: No  . Drug use: No  . Sexual activity: Yes    Birth control/protection: None  Lifestyle  . Physical activity:    Days per week: Not on file    Minutes per session: Not on file  . Stress: Not on file  Relationships  . Social connections:    Talks on phone: Not on file    Gets together: Not on file    Attends religious service: Not on file    Active member of club or organization: Not on file    Attends meetings of clubs or organizations: Not on file    Relationship status: Not on file  . Intimate partner violence:    Fear of current or ex partner: Not on file    Emotionally abused: Not on file    Physically abused: Not on file    Forced sexual activity: Not on file  Other Topics Concern  . Not on file  Social History Narrative  . Not on file     PHYSICAL EXAM   Vitals:   04/10/18 0717  BP: 117/78  Pulse: 75  Resp: 16  Weight: 119 lb (54 kg)  Height: 5\' 5"  (1.651 m)    Not recorded      Body mass index is 19.8 kg/m.  PHYSICAL EXAMNIATION:  Gen: NAD, conversant, well nourised, obese, well groomed                     Cardiovascular: Regular rate rhythm, no peripheral edema, warm, nontender. Eyes: Conjunctivae clear without exudates or hemorrhage Neck: Supple, no carotid  bruits. Pulmonary: Clear to auscultation bilaterally   NEUROLOGICAL EXAM:  MENTAL STATUS: Speech:    Speech is normal; fluent and spontaneous with normal comprehension.  Cognition:     Orientation to time, place and person     Normal recent and remote memory     Normal Attention span and concentration     Normal Language, naming, repeating,spontaneous speech     Fund of knowledge   CRANIAL NERVES: CN II: Visual fields are full  to confrontation. Fundoscopic exam is normal with sharp discs and no vascular changes. Pupils are round equal and briskly reactive to light. CN III, IV, VI: extraocular movement are normal. No ptosis. CN V: Facial sensation is intact to pinprick in all 3 divisions bilaterally. Corneal responses are intact.  CN VII: Face is symmetric with normal eye closure and smile. CN VIII: Hearing is normal to rubbing fingers CN IX, X: Palate elevates symmetrically. Phonation is normal. CN XI: Head turning and shoulder shrug are intact CN XII: Tongue is midline with normal movements and no atrophy.  MOTOR: There is no pronator drift of out-stretched arms. Muscle bulk and tone are normal. Muscle strength is normal.  REFLEXES: Reflexes are 2+ and symmetric at the biceps, triceps, knees, and ankles. Plantar responses are flexor.  SENSORY: Intact to light touch, pinprick, positional sensation and vibratory sensation are intact in fingers and toes.  COORDINATION: Rapid alternating movements and fine finger movements are intact. There is no dysmetria on finger-to-nose and heel-knee-shin.    GAIT/STANCE: Posture is normal. Gait is steady with normal steps, base, arm swing, and turning. Heel and toe walking are normal. Tandem gait is normal.  Romberg is absent.   DIAGNOSTIC DATA (LABS, IMAGING, TESTING) - I reviewed patient records, labs, notes, testing and imaging myself where available.   ASSESSMENT AND PLAN  ETHELYN CERNIGLIA is a 37 y.o. female   Chronic migraine  headache Spells of metallic taste  Because of her frequent metallic taste probably to the migraine,  Will proceed with MRI of the brain to rule out structural lesion  Start Topamax 100 mg as migraine prevention  Suboptimal response to Imitrex, will try Maxalt 10 mg as needed may combine it together with Zofran,   Levert Feinstein, M.D. Ph.D.  [facility] [address] [place], [place] [address] Ph: [phone] Fax: [phone]  CC: Referring Provider

## 2018-04-11 ENCOUNTER — Telehealth: Payer: Self-pay | Admitting: *Deleted

## 2018-04-11 NOTE — Telephone Encounter (Addendum)
PA for Ajovy started, via fax, with [place] Medicaid ([phone] - pt LK#[id number] L. Decision pending.

## 2018-04-13 ENCOUNTER — Telehealth: Payer: Self-pay | Admitting: Neurology

## 2018-04-13 NOTE — Telephone Encounter (Signed)
Pt called stating she started venlafaxine XR (EFFEXOR XR) 37.5 MG 24 hr capsule yesterday 6/26, stating both hands have started to termor. Pt aware this is a s/e requesting a call to discuss if this will last awhile and if Dr. Terrace ArabiaYan wants her to continue this medication

## 2018-04-13 NOTE — Telephone Encounter (Signed)
Spoke to patient - reports the tremor in her hands to be mild and not interfering with her daily activities.  Per vo by Dr. Terrace ArabiaYan, continue the medication to see if the side effects subside.  Instructed the patient to call me back in two weeks for an update or earlier if her symptoms worsen.  She was agreeable to this plan.

## 2018-04-14 ENCOUNTER — Encounter: Payer: Self-pay | Admitting: Neurology

## 2018-04-17 ENCOUNTER — Ambulatory Visit
Admission: RE | Admit: 2018-04-17 | Discharge: 2018-04-17 | Disposition: A | Discharge status: Home / Self Care | Payer: Medicaid Other | Source: Ambulatory Visit | Attending: Neurology | Admitting: Neurology

## 2018-04-17 DIAGNOSIS — R51 Headache: Secondary | ICD-10-CM | POA: Diagnosis not present

## 2018-04-17 DIAGNOSIS — R519 Headache, unspecified: Secondary | ICD-10-CM

## 2018-04-18 ENCOUNTER — Telehealth: Payer: Self-pay | Admitting: Neurology

## 2018-04-18 NOTE — Telephone Encounter (Signed)
Pt said she had MRI yesterday at [facility] and was told results would be ready today. I advised her the provider may get the image within 24 hours but it can take up to 48 hours for the images to be read. She understood. Please call when results are ready

## 2018-06-28 ENCOUNTER — Ambulatory Visit: Payer: Medicaid Other | Admitting: Neurology

## 2018-06-28 ENCOUNTER — Encounter: Payer: Self-pay | Admitting: Neurology

## 2018-06-28 VITALS — BP 113/73 | HR 73 | Ht 65.0 in | Wt 123.0 lb

## 2018-06-28 DIAGNOSIS — IMO0002 Reserved for concepts with insufficient information to code with codable children: Secondary | ICD-10-CM

## 2018-06-28 DIAGNOSIS — G43709 Chronic migraine without aura, not intractable, without status migrainosus: Secondary | ICD-10-CM

## 2018-06-28 MED ORDER — VENLAFAXINE HCL ER 75 MG PO CP24: 75.0000 mg | ORAL_CAPSULE | Freq: Every day | ORAL | 11 refills | Status: DC

## 2018-06-28 MED ORDER — RIZATRIPTAN BENZOATE 10 MG PO TBDP: 10.0000 mg | ORAL_TABLET | ORAL | 11 refills | Status: DC | PRN

## 2018-06-28 NOTE — Telephone Encounter (Signed)
PA approved through 04/06/2019.

## 2018-06-28 NOTE — Progress Notes (Signed)
PATIENT: Alicia Wade DOB: 1981-06-15  Chief Complaint  Patient presents with  . Migraine    Reports still having eight migraine days over the last month.  However, she feels the duration is shorter and overall the pain is less intense.  She is taking both Topamax and Effexor.  She never started Ajovy due to insurance reasons.  Rizatriptan is helpful for her pain.      HISTORICAL  Alicia Wade is a 37 year old female, seen in refer by her primary care physician Dr. Richardean Chimera for evaluation of headache, initial evaluation was on April 10, 2018.  She reported history of migraine headaches since age 80, her typical migraine are lateralized severe pounding headache with associated light noise sensitivity, she was treated with Topamax as preventive medication, which has been helpful,  Going stress of divorce since 2015, she began to have frequent headaches, she also reported history of chronic sinusitis, often preceding her severe headaches she would have strong metallic taste in her mouth, seems only involving one side of the nostril,  Since April 2019 she began to have more frequent headaches, almost daily basis, it can last for days, sometimes woke up in the morning with severe headaches,  Previously she has tried propanolol as migraine prevention did not help, amitriptyline caused significant side effect,  She had somewhat response to Imitrex as abortive treatment, but seems not much better than Excedrin Migraine.  UPDATE Sept 11 2019: I personally reviewed MRI of the brain that was normal in July 2019  Her headache has much improved, she tolerates Topamax 100 mg twice a day and the Effexor 37.5 mg once a day well, has headaches couple times each week,  REVIEW OF SYSTEMS: Full 14 system review of systems performed and notable only for as above  ALLERGIES: Allergies  Allergen Reactions  . Augmentin [Amoxicillin-Pot Clavulanate] Diarrhea and Nausea And Vomiting  .  Penicillins Hives  . Zithromax [Azithromycin] Diarrhea and Nausea And Vomiting    HOME MEDICATIONS: Current Outpatient Medications  Medication Sig Dispense Refill  . Fremanezumab-vfrm (AJOVY) 225 MG/1.5ML SOSY Inject 225 mg into the skin as needed. 1 Syringe 11  . ketoconazole (NIZORAL) 2 % shampoo Use in scalp and face wash in shower, try to leave on for a few minutes then wash out, few times weekly    . Levonorgestrel-Ethinyl Estradiol (AMETHIA,CAMRESE) 0.1-0.02 & 0.01 MG tablet Take by mouth.    . meclizine (ANTIVERT) 25 MG tablet Take 25 mg by mouth 3 (three) times daily as needed for dizziness.    . Milnacipran (SAVELLA) 50 MG TABS tablet TAKE 1 TABLET BY MOUTH TWICE DAILY FOR FIBROMYALGIA    . ondansetron (ZOFRAN ODT) 4 MG disintegrating tablet Take 1 tablet (4 mg total) by mouth every 8 (eight) hours as needed. 20 tablet 6  . pantoprazole (PROTONIX) 40 MG tablet Take 40 mg by mouth daily.    . rizatriptan (MAXALT-MLT) 10 MG disintegrating tablet Take 1 tablet (10 mg total) by mouth as needed for migraine. May repeat in 2 hours if needed 12 tablet 6  . tiZANidine (ZANAFLEX) 4 MG tablet Take by mouth.    . topiramate (TOPAMAX) 100 MG tablet TAKE 1 TABLET BY MOUTH TWICE A DAY    . traMADol (ULTRAM) 50 MG tablet Take 1 tablet (50 mg total) by mouth every 6 (six) hours as needed for pain. 40 tablet 0  . venlafaxine XR (EFFEXOR XR) 37.5 MG 24 hr capsule Take 1 capsule (37.5  mg total) by mouth daily with breakfast. 30 capsule 11   No current facility-administered medications for this visit.     PAST MEDICAL HISTORY: Past Medical History:  Diagnosis Date  . Gastritis   . History of anemia    during her pregnancy  . History of cellulitis    right foot - will finish antibiotic 07/05/2013  . History of kidney stones   . Lactating mother 06/2013  . Retained orthopedic hardware 06/2013   left foot    PAST SURGICAL HISTORY: Past Surgical History:  Procedure Laterality Date  . CESAREAN  SECTION  07/02/2010  . CESAREAN SECTION  07/31/2012   Procedure: CESAREAN SECTION;  Surgeon: Jeani Hawking, MD;  Location: [facility];  Service: Obstetrics;  Laterality: N/A;  . DILATION AND EVACUATION  10/06/2007; 10/09/2007  . FOOT SURGERY Left   . HARDWARE REMOVAL Left 07/06/2013   Procedure: HARDWARE REMOVAL:LEFT FOOT;  Surgeon: Sheral Apley, MD;  Location: [facility];  Service: Orthopedics;  Laterality: Left;  . WISDOM TOOTH EXTRACTION      FAMILY HISTORY: Family History  Problem Relation Age of Onset  . Anesthesia problems Mother        hyperventilates/panic attack  . Other Father        hit by car  . Migraines Sister   . Migraines Brother     SOCIAL HISTORY:  Social History   Socioeconomic History  . Marital status: Married    Spouse name: Not on file  . Number of children: Not on file  . Years of education: Not on file  . Highest education level: Not on file  Occupational History  . Not on file  Social Needs  . Financial resource strain: Not on file  . Food insecurity:    Worry: Not on file    Inability: Not on file  . Transportation needs:    Medical: Not on file    Non-medical: Not on file  Tobacco Use  . Smoking status: Never Smoker  . Smokeless tobacco: Never Used  Substance and Sexual Activity  . Alcohol use: No  . Drug use: No  . Sexual activity: Yes    Birth control/protection: None  Lifestyle  . Physical activity:    Days per week: Not on file    Minutes per session: Not on file  . Stress: Not on file  Relationships  . Social connections:    Talks on phone: Not on file    Gets together: Not on file    Attends religious service: Not on file    Active member of club or organization: Not on file    Attends meetings of clubs or organizations: Not on file    Relationship status: Not on file  . Intimate partner violence:    Fear of current or ex partner: Not on file    Emotionally abused: Not on file    Physically abused: Not  on file    Forced sexual activity: Not on file  Other Topics Concern  . Not on file  Social History Narrative  . Not on file     PHYSICAL EXAM   Vitals:   06/28/18 0854  BP: 113/73  Pulse: 73  Weight: 123 lb (55.8 kg)  Height: 5\' 5"  (1.651 m)    Not recorded      Body mass index is 20.47 kg/m.  PHYSICAL EXAMNIATION:  Gen: NAD, conversant, well nourised, obese, well groomed  Cardiovascular: Regular rate rhythm, no peripheral edema, warm, nontender. Eyes: Conjunctivae clear without exudates or hemorrhage Neck: Supple, no carotid bruits. Pulmonary: Clear to auscultation bilaterally   NEUROLOGICAL EXAM:  MENTAL STATUS: Speech:    Speech is normal; fluent and spontaneous with normal comprehension.  Cognition:     Orientation to time, place and person     Normal recent and remote memory     Normal Attention span and concentration     Normal Language, naming, repeating,spontaneous speech     Fund of knowledge   CRANIAL NERVES: CN II: Visual fields are full to confrontation. Fundoscopic exam is normal with sharp discs and no vascular changes. Pupils are round equal and briskly reactive to light. CN III, IV, VI: extraocular movement are normal. No ptosis. CN V: Facial sensation is intact to pinprick in all 3 divisions bilaterally. Corneal responses are intact.  CN VII: Face is symmetric with normal eye closure and smile. CN VIII: Hearing is normal to rubbing fingers CN IX, X: Palate elevates symmetrically. Phonation is normal. CN XI: Head turning and shoulder shrug are intact CN XII: Tongue is midline with normal movements and no atrophy.  MOTOR: There is no pronator drift of out-stretched arms. Muscle bulk and tone are normal. Muscle strength is normal.  REFLEXES: Reflexes are 2+ and symmetric at the biceps, triceps, knees, and ankles. Plantar responses are flexor.  SENSORY: Intact to light touch, pinprick, positional sensation and vibratory  sensation are intact in fingers and toes.  COORDINATION: Rapid alternating movements and fine finger movements are intact. There is no dysmetria on finger-to-nose and heel-knee-shin.    GAIT/STANCE: Posture is normal. Gait is steady with normal steps, base, arm swing, and turning. Heel and toe walking are normal. Tandem gait is normal.  Romberg is absent.   DIAGNOSTIC DATA (LABS, IMAGING, TESTING) - I reviewed patient records, labs, notes, testing and imaging myself where available.   ASSESSMENT AND PLAN  LALANI WINKLES is a 37 y.o. female   Chronic migraine headache    ContineTopamax 100 mg as migraine prevention  Increase effexor to 75mg  daily  Maxalt as needed.  [place]  Levert Feinstein, M.D. Ph.D.  [facility] [address] [place], [place] [address] Ph: [phone] Fax: [phone]  CC: Referring Provider

## 2018-08-30 ENCOUNTER — Telehealth: Payer: Self-pay | Admitting: Neurology

## 2018-08-30 NOTE — Telephone Encounter (Signed)
Spoke to patient - she is aware that the approval for Ajovy is on file through 04/06/2019.  A copy of her approval has [facility]  If the pharmacy is having problems, they may need to call the pharmacy help desk.

## 2018-08-30 NOTE — Telephone Encounter (Signed)
Pt is asking RN Marcelino DusterMichelle calls her re: being told that now a PA is needed for her Fremanezumab-vfrm (AJOVY) 225 MG/1.5ML SOSY by her pharmacy  [id number] [place], [place] - [address] [phone] (Phone) [phone] (Fax)

## 2018-09-01 ENCOUNTER — Telehealth: Payer: Self-pay | Admitting: *Deleted

## 2018-09-01 NOTE — Telephone Encounter (Signed)
PA for Ajovy started with [place] [employer] [phone](PA#[id number]) - pt UV#[id number] L. Decision pending.

## 2018-09-04 NOTE — Telephone Encounter (Signed)
PA approved through 09/06/2019.

## 2018-10-16 NOTE — Progress Notes (Deleted)
[facility]  PATIENT: Alicia Wade DOB: 01/14/1981   REASON FOR VISIT: Follow-up for migraine HISTORY FROM:    HISTORY OF PRESENT ILLNESS: Alicia Sanelizabeth S Doshier is a 37 year old female, seen in refer by her primary care physician Dr. Richardean Chimeraaniel, Terry G for evaluation of headache, initial evaluation was on April 10, 2018.  She reported history of migraine headaches since age 37, her typical migraine are lateralized severe pounding headache with associated light noise sensitivity, she was treated with Topamax as preventive medication, which has been helpful,  Going stress of divorce since 2015, she began to have frequent headaches, she also reported history of chronic sinusitis, often preceding her severe headaches she would have strong metallic taste in her mouth, seems only involving one side of the nostril,  Since April 2019 she began to have more frequent headaches, almost daily basis, it can last for days, sometimes woke up in the morning with severe headaches,  Previously she has tried propanolol as migraine prevention did not help, amitriptyline caused significant side effect,  She had somewhat response to Imitrex as abortive treatment, but seems not much better than Excedrin Migraine.  UPDATE Sept 11 2019:YY I personally reviewed MRI of the brain that was normal in July 2019  Her headache has much improved, she tolerates Topamax 100 mg twice a day and the Effexor 37.5 mg once a day well, has headaches couple times each week,   REVIEW OF SYSTEMS: Full 14 system review of systems performed and notable only for those listed, all others are neg:  Constitutional: neg  Cardiovascular: neg Ear/Nose/Throat: neg  Skin: neg Eyes: neg Respiratory: neg Gastroitestinal: neg  Hematology/Lymphatic: neg  Endocrine: neg Musculoskeletal:neg Allergy/Immunology: neg Neurological: neg Psychiatric: neg Sleep : neg   ALLERGIES: Allergies  Allergen Reactions   . Augmentin [Amoxicillin-Pot Clavulanate] Diarrhea and Nausea And Vomiting  . Penicillins Hives  . Zithromax [Azithromycin] Diarrhea and Nausea And Vomiting    HOME MEDICATIONS: Outpatient Medications Prior to Visit  Medication Sig Dispense Refill  . Fremanezumab-vfrm (AJOVY) 225 MG/1.5ML SOSY Inject 225 mg into the skin.    Marland Kitchen. ketoconazole (NIZORAL) 2 % shampoo Use in scalp and face wash in shower, try to leave on for a few minutes then wash out, few times weekly    . Levonorgestrel-Ethinyl Estradiol (AMETHIA,CAMRESE) 0.1-0.02 & 0.01 MG tablet Take by mouth.    . meclizine (ANTIVERT) 25 MG tablet Take 25 mg by mouth 3 (three) times daily as needed for dizziness.    . Milnacipran (SAVELLA) 50 MG TABS tablet TAKE 1 TABLET BY MOUTH TWICE DAILY FOR FIBROMYALGIA    . ondansetron (ZOFRAN ODT) 4 MG disintegrating tablet Take 1 tablet (4 mg total) by mouth every 8 (eight) hours as needed. 20 tablet 6  . pantoprazole (PROTONIX) 40 MG tablet Take 40 mg by mouth daily.    . rizatriptan (MAXALT-MLT) 10 MG disintegrating tablet Take 1 tablet (10 mg total) by mouth as needed for migraine. May repeat in 2 hours if needed 7 tablet 11  . tiZANidine (ZANAFLEX) 4 MG tablet Take by mouth.    . topiramate (TOPAMAX) 100 MG tablet TAKE 1 TABLET BY MOUTH TWICE A DAY    . traMADol (ULTRAM) 50 MG tablet Take 1 tablet (50 mg total) by mouth every 6 (six) hours as needed for pain. 40 tablet 0  . venlafaxine XR (EFFEXOR-XR) 75 MG 24 hr capsule Take 1 capsule (75 mg total) by mouth daily with breakfast. 30 capsule 11  No facility-administered medications prior to visit.     PAST MEDICAL HISTORY: Past Medical History:  Diagnosis Date  . Gastritis   . History of anemia    during her pregnancy  . History of cellulitis    right foot - will finish antibiotic 07/05/2013  . History of kidney stones   . Lactating mother 06/2013  . Retained orthopedic hardware 06/2013   left foot    PAST SURGICAL HISTORY: Past  Surgical History:  Procedure Laterality Date  . CESAREAN SECTION  07/02/2010  . CESAREAN SECTION  07/31/2012   Procedure: CESAREAN SECTION;  Surgeon: Jeani Hawking, MD;  Location: [facility];  Service: Obstetrics;  Laterality: N/A;  . DILATION AND EVACUATION  10/06/2007; 10/09/2007  . FOOT SURGERY Left   . HARDWARE REMOVAL Left 07/06/2013   Procedure: HARDWARE REMOVAL:LEFT FOOT;  Surgeon: Sheral Apley, MD;  Location: [facility];  Service: Orthopedics;  Laterality: Left;  . WISDOM TOOTH EXTRACTION      FAMILY HISTORY: Family History  Problem Relation Age of Onset  . Anesthesia problems Mother        hyperventilates/panic attack  . Other Father        hit by car  . Migraines Sister   . Migraines Brother     SOCIAL HISTORY: Social History   Socioeconomic History  . Marital status: Married    Spouse name: Not on file  . Number of children: Not on file  . Years of education: Not on file  . Highest education level: Not on file  Occupational History  . Not on file  Social Needs  . Financial resource strain: Not on file  . Food insecurity:    Worry: Not on file    Inability: Not on file  . Transportation needs:    Medical: Not on file    Non-medical: Not on file  Tobacco Use  . Smoking status: Never Smoker  . Smokeless tobacco: Never Used  Substance and Sexual Activity  . Alcohol use: No  . Drug use: No  . Sexual activity: Yes    Birth control/protection: None  Lifestyle  . Physical activity:    Days per week: Not on file    Minutes per session: Not on file  . Stress: Not on file  Relationships  . Social connections:    Talks on phone: Not on file    Gets together: Not on file    Attends religious service: Not on file    Active member of club or organization: Not on file    Attends meetings of clubs or organizations: Not on file    Relationship status: Not on file  . Intimate partner violence:    Fear of current or ex partner: Not on file     Emotionally abused: Not on file    Physically abused: Not on file    Forced sexual activity: Not on file  Other Topics Concern  . Not on file  Social History Narrative  . Not on file     PHYSICAL EXAM  There were no vitals filed for this visit. There is no height or weight on file to calculate BMI.  Generalized: Well developed, in no acute distress  Head: normocephalic and atraumatic,. Oropharynx benign  Neck: Supple, no carotid bruits  Cardiac: Regular rate rhythm, no murmur  Musculoskeletal: No deformity   Neurological examination   Mentation: Alert oriented to time, place, history taking. Attention span and concentration appropriate. Recent and remote memory  intact.  Follows all commands speech and language fluent.   Cranial nerve II-XII: Fundoscopic exam reveals sharp disc margins.Pupils were equal round reactive to light extraocular movements were full, visual field were full on confrontational test. Facial sensation and strength were normal. hearing was intact to finger rubbing bilaterally. Uvula tongue midline. head turning and shoulder shrug were normal and symmetric.Tongue protrusion into cheek strength was normal. Motor: normal bulk and tone, full strength in the BUE, BLE, fine finger movements normal, no pronator drift. No focal weakness Sensory: normal and symmetric to light touch, pinprick, and  Vibration, proprioception  Coordination: finger-nose-finger, heel-to-shin bilaterally, no dysmetria Reflexes: Brachioradialis 2/2, biceps 2/2, triceps 2/2, patellar 2/2, Achilles 2/2, plantar responses were flexor bilaterally. Gait and Station: Rising up from seated position without assistance, normal stance,  moderate stride, good arm swing, smooth turning, able to perform tiptoe, and heel walking without difficulty. Tandem gait is steady  DIAGNOSTIC DATA (LABS, IMAGING, TESTING) - I reviewed patient records, labs, notes, testing and imaging myself where available.  Lab  Results  Component Value Date   WBC 10.5 08/01/2012   HGB 10.1 (L) 08/01/2012   HCT 29.6 (L) 08/01/2012   MCV 87.8 08/01/2012   PLT 133 (L) 08/01/2012      Component Value Date/Time   NA 135 04/24/2012 0814   K 3.5 04/24/2012 0814   CL 103 04/24/2012 0814   CO2 25 04/24/2012 0814   GLUCOSE 88 04/24/2012 0814   BUN 7 04/24/2012 0814   CREATININE 0.46 (L) 04/24/2012 0814   CALCIUM 8.7 04/24/2012 0814   PROT 6.4 04/24/2012 0814   ALBUMIN 2.9 (L) 04/24/2012 0814   AST 19 04/24/2012 0814   ALT 14 04/24/2012 0814   ALKPHOS 49 04/24/2012 0814   BILITOT 0.2 (L) 04/24/2012 0814   GFRNONAA >90 04/24/2012 0814   GFRAA >90 04/24/2012 0814   No results found for: CHOL, HDL, LDLCALC, LDLDIRECT, TRIG, CHOLHDL No results found for: RUEA5WHGBA1C No results found for: VITAMINB12 No results found for: TSH    ASSESSMENT AND PLAN  37 y.o. year old female  has a past medical history of Gastritis, History of anemia, History of cellulitis, History of kidney stones, Lactating mother (06/2013), and Retained orthopedic hardware (06/2013). here with ***  Alicia Sanelizabeth S Sakai is a 37 y.o. female   Chronic migraine headache               ContineTopamax 100 mg as migraine prevention             Increase effexor to 75mg  daily             Maxalt as needed.             [place]imovig  Nilda RiggsNancy Carolyn Emireth Cockerham, [facility] IncGNP, [facility] HospitalBC, APRN  [facility] [address], [address] [place], [place] [address] [phone]

## 2018-10-19 ENCOUNTER — Ambulatory Visit: Payer: Medicaid Other | Admitting: Nurse Practitioner

## 2018-10-23 ENCOUNTER — Encounter: Payer: Self-pay | Admitting: Nurse Practitioner

## 2018-11-13 NOTE — Progress Notes (Signed)
[facility]  PATIENT: Alicia Wade DOB: [birth year]   REASON FOR VISIT: Follow-up for migraine HISTORY FROM:patient    HISTORY OF PRESENT ILLNESS: Alicia Wade is a 38 year old Wade, seen in refer by her primary care physician Dr. Richardean Chimera for evaluation of headache, initial evaluation was on April 10, 2018.  She reported history of migraine headaches since age 85, her typical migraine are lateralized severe pounding headache with associated light noise sensitivity, she was treated with Topamax as preventive medication, which has been helpful,  Going stress of divorce since 2015, she began to have frequent headaches, she also reported history of chronic sinusitis, often preceding her severe headaches she would have strong metallic taste in her mouth, seems only involving one side of the nostril,  Since April 2019 she began to have more frequent headaches, almost daily basis, it can last for days, sometimes woke up in the morning with severe headaches,  Previously she has tried propanolol as migraine prevention did not help, amitriptyline caused significant side effect,  She had somewhat response to Imitrex as abortive treatment, but seems not much better than Excedrin Migraine.  UPDATE Sept 11 2019:YY I personally reviewed MRI of the brain that was normal in July 2019  Her headache has much improved, she tolerates Topamax 100 mg twice a day and the Effexor 37.5 mg once a day well, has headaches couple times each week,  UPDATE 1/28/2020CM Alicia Wade 38 year old Wade returns for follow-up with history of migraine headaches.  She is currently on Topamax 100 mg twice daily.  She is on Effexor 75 mg daily.  Most recently she was placed on Aimovig doing well with only 1 headache per month.  She denies any injection site issues.  She takes Maxalt acutely.  She is not aware of any migraine triggers except for stress and weather changes.  She is  not certain about foods.  She has significant fibroids and pelvic pain and interstitial cystitis.  She is discussing a hysterectomy with her physician.  She also has a history of fibromyalgia.  She does not work.  She returns for reevaluation REVIEW OF SYSTEMS: Full 14 system review of systems performed and notable only for those listed, all others are neg:  Constitutional: Fatigue Cardiovascular: neg Ear/Nose/Throat: neg  Skin: neg Eyes: neg Respiratory: neg Gastroitestinal: neg  Hematology/Lymphatic: neg  Endocrine: neg Musculoskeletal: Back pain Allergy/Immunology: neg Neurological: Headaches Psychiatric: neg Sleep : neg   ALLERGIES: Allergies  Allergen Reactions  . Augmentin [Amoxicillin-Pot Clavulanate] Diarrhea and Nausea And Vomiting  . Penicillins Hives  . Zithromax [Azithromycin] Diarrhea and Nausea And Vomiting    HOME MEDICATIONS: Outpatient Medications Prior to Visit  Medication Sig Dispense Refill  . Fremanezumab-vfrm (AJOVY) 225 MG/1.5ML SOSY Inject 225 mg into the skin.    . Levonorgestrel-Ethinyl Estradiol (AMETHIA,CAMRESE) 0.1-0.02 & 0.01 MG tablet Take by mouth.    . meclizine (ANTIVERT) 25 MG tablet Take 25 mg by mouth 3 (three) times daily as needed for dizziness.    . ondansetron (ZOFRAN ODT) 4 MG disintegrating tablet Take 1 tablet (4 mg total) by mouth every 8 (eight) hours as needed. 20 tablet 6  . pantoprazole (PROTONIX) 40 MG tablet Take 40 mg by mouth daily.    . rizatriptan (MAXALT-MLT) 10 MG disintegrating tablet Take 1 tablet (10 mg total) by mouth as needed for migraine. May repeat in 2 hours if needed 7 tablet 11  . tiZANidine (ZANAFLEX) 4 MG tablet Take by mouth.    Marland Kitchen  topiramate (TOPAMAX) 100 MG tablet TAKE 1 TABLET BY MOUTH TWICE A DAY    . traMADol (ULTRAM) 50 MG tablet Take 1 tablet (50 mg total) by mouth every 6 (six) hours as needed for pain. 40 tablet 0  . venlafaxine XR (EFFEXOR-XR) 75 MG 24 hr capsule Take 1 capsule (75 mg total) by mouth  daily with breakfast. 30 capsule 11  . ketoconazole (NIZORAL) 2 % shampoo Use in scalp and face wash in shower, try to leave on for a few minutes then wash out, few times weekly    . Milnacipran (SAVELLA) 50 MG TABS tablet TAKE 1 TABLET BY MOUTH TWICE DAILY FOR FIBROMYALGIA     No facility-administered medications prior to visit.     PAST MEDICAL HISTORY: Past Medical History:  Diagnosis Date  . Gastritis   . History of anemia    during her pregnancy  . History of cellulitis    right foot - will finish antibiotic 07/05/2013  . History of kidney stones   . Lactating mother 06/2013  . Migraines   . Retained orthopedic hardware 06/2013   left foot    PAST SURGICAL HISTORY: Past Surgical History:  Procedure Laterality Date  . CESAREAN SECTION  07/02/2010  . CESAREAN SECTION  07/31/2012   Procedure: CESAREAN SECTION;  Surgeon: Jeani HawkingMichelle L Grewal, MD;  Location: [facility];  Service: Obstetrics;  Laterality: N/A;  . DILATION AND EVACUATION  10/06/2007; 10/09/2007  . FOOT SURGERY Left   . HARDWARE REMOVAL Left 07/06/2013   Procedure: HARDWARE REMOVAL:LEFT FOOT;  Surgeon: Sheral Apleyimothy D Murphy, MD;  Location: [facility];  Service: Orthopedics;  Laterality: Left;  . WISDOM TOOTH EXTRACTION      FAMILY HISTORY: Family History  Problem Relation Age of Onset  . Anesthesia problems Mother        hyperventilates/panic attack  . Other Father        hit by car  . Migraines Sister   . Migraines Brother     SOCIAL HISTORY: Social History   Socioeconomic History  . Marital status: Married    Spouse name: Not on file  . Number of children: Not on file  . Years of education: Not on file  . Highest education level: Not on file  Occupational History  . Not on file  Social Needs  . Financial resource strain: Not on file  . Food insecurity:    Worry: Not on file    Inability: Not on file  . Transportation needs:    Medical: Not on file    Non-medical: Not on file  Tobacco Use    . Smoking status: Never Smoker  . Smokeless tobacco: Never Used  Substance and Sexual Activity  . Alcohol use: No  . Drug use: No  . Sexual activity: Yes    Birth control/protection: None  Lifestyle  . Physical activity:    Days per week: Not on file    Minutes per session: Not on file  . Stress: Not on file  Relationships  . Social connections:    Talks on phone: Not on file    Gets together: Not on file    Attends religious service: Not on file    Active member of club or organization: Not on file    Attends meetings of clubs or organizations: Not on file    Relationship status: Not on file  . Intimate partner violence:    Fear of current or ex partner: Not on file  Emotionally abused: Not on file    Physically abused: Not on file    Forced sexual activity: Not on file  Other Topics Concern  . Not on file  Social History Narrative  . Not on file     PHYSICAL EXAM  Vitals:   11/14/18 0957  BP: 107/71  Pulse: 86  Weight: 124 lb 6.4 oz (56.4 kg)  Height: 5\' 5"  (1.651 m)   Body mass index is 20.7 kg/m.  Generalized: Well developed, in no acute distress  Head: normocephalic and atraumatic,. Oropharynx benign  Neck: Supple,  Musculoskeletal: No deformity   Neurological examination   Mentation: Alert oriented to time, place, history taking. Attention span and concentration appropriate. Recent and remote memory intact.  Follows all commands speech and language fluent.   Cranial nerve II-XII: .Pupils were equal round reactive to light extraocular movements were full, visual field were full on confrontational test. Facial sensation and strength were normal. hearing was intact to finger rubbing bilaterally. Uvula tongue midline. head turning and shoulder shrug were normal and symmetric.Tongue protrusion into cheek strength was normal. Motor: normal bulk and tone, full strength in the BUE, BLE, fine finger movements normal, no pronator drift. No focal  weakness Sensory: normal and symmetric to light touch,  Coordination: finger-nose-finger, heel-to-shin bilaterally, no dysmetria Reflexes: Symmetric upper and lower plantar responses were flexor bilaterally. Gait and Station: Rising up from seated position without assistance, normal stance,  moderate stride, good arm swing, smooth turning, able to perform tiptoe, and heel walking without difficulty. Tandem gait is steady  DIAGNOSTIC DATA (LABS, IMAGING, TESTING) - I reviewed patient records, labs, notes, testing and imaging myself where available.  Lab Results  Component Value Date   WBC 10.5 08/01/2012   HGB 10.1 (L) 08/01/2012   HCT 29.6 (L) 08/01/2012   MCV 87.8 08/01/2012   PLT 133 (L) 08/01/2012      Component Value Date/Time   NA 135 04/24/2012 0814   K 3.5 04/24/2012 0814   CL 103 04/24/2012 0814   CO2 25 04/24/2012 0814   GLUCOSE 88 04/24/2012 0814   BUN 7 04/24/2012 0814   CREATININE 0.46 (L) 04/24/2012 0814   CALCIUM 8.7 04/24/2012 0814   PROT 6.4 04/24/2012 0814   ALBUMIN 2.9 (L) 04/24/2012 0814   AST 19 04/24/2012 0814   ALT 14 04/24/2012 0814   ALKPHOS 49 04/24/2012 0814   BILITOT 0.2 (L) 04/24/2012 0814   GFRNONAA >90 04/24/2012 0814   GFRAA >90 04/24/2012 0814     ASSESSMENT AND PLAN  38 y.o. year old Wade  has a past medical history chronic migraine headache here to follow-up.  She was placed on Ajovy at her last visit which has reduced her headaches to about once per month.    PLAN: Continue Topamax 100 mg twice daily Continue Ajovy monthly Continue Effexor 75 mg daily Continue Maxalt acutely Given list of migraine triggers Keep a record of your headaches if they worsen Follow-up 8 months  I spent 25 minutes in total face to face time with the patient more than 50% of which was spent counseling and coordination of care, reviewing test results reviewing medications and discussing and reviewing the diagnosis of migraine and further treatment options.   Also discussed migraine triggers, given a list of common foods.  Discussed her upcoming hysterectomy which may or may not have a bearing on her headaches depending on if her ovaries are taken, additional questions answered  Nilda Riggs, [facility], [facility], APRN [facility] [address] [place], [place] [address] [phone]

## 2018-11-14 ENCOUNTER — Encounter: Payer: Self-pay | Admitting: Nurse Practitioner

## 2018-11-14 ENCOUNTER — Ambulatory Visit: Payer: Medicaid Other | Admitting: Nurse Practitioner

## 2018-11-14 VITALS — BP 107/71 | HR 86 | Ht 65.0 in | Wt 124.4 lb

## 2018-11-14 DIAGNOSIS — IMO0002 Reserved for concepts with insufficient information to code with codable children: Secondary | ICD-10-CM

## 2018-11-14 DIAGNOSIS — G43709 Chronic migraine without aura, not intractable, without status migrainosus: Secondary | ICD-10-CM | POA: Diagnosis not present

## 2018-11-14 NOTE — Patient Instructions (Signed)
Continue Topamax 100 mg twice daily Continue Ajovy monthly Continue Effexor 75 mg daily Continue Maxalt acutely Given list of migraine triggers Keep a record of your headaches if they worsen Follow-up 8 months

## 2018-11-14 NOTE — Progress Notes (Signed)
I have reviewed and agreed above plan. 

## 2019-01-11 ENCOUNTER — Inpatient Hospital Stay (HOSPITAL_COMMUNITY)
Admission: RE | Admit: 2019-01-11 | Discharge: 2019-01-11 | Disposition: A | Discharge status: Home / Self Care | Payer: Medicaid Other | Source: Ambulatory Visit

## 2019-02-13 ENCOUNTER — Other Ambulatory Visit (HOSPITAL_COMMUNITY): Payer: Medicaid Other

## 2019-02-20 ENCOUNTER — Encounter (HOSPITAL_COMMUNITY): Admission: RE | Payer: Self-pay | Source: Home / Self Care

## 2019-02-20 ENCOUNTER — Inpatient Hospital Stay (HOSPITAL_COMMUNITY)
Admission: RE | Admit: 2019-02-20 | Payer: Medicaid Other | Source: Home / Self Care | Admitting: Obstetrics and Gynecology

## 2019-02-20 SURGERY — HYSTERECTOMY, ABDOMINAL, WITH SALPINGO-OOPHORECTOMY
Anesthesia: Choice | Laterality: Bilateral

## 2019-02-22 NOTE — Patient Instructions (Addendum)
Alicia Wade  02/22/2019      Your procedure is scheduled on 02-27-2019  [facility]  [facility]  Call this number if you have problems the morning of surgery:[phone]  OUR ADDRESS IS [address], WE ARE LOCATED [facility] THE [place] WITH [facility].     Remember: NO SOLID FOOD AFTER MIDNIGHT THE NIGHT PRIOR TO SURGERY. YOU MAY DRINK CLEAR LIQUIDS.   STOP CLEAR LIQUIDS AT ___4:30AM______ AND THEN DRINK THE ENSURE PRE-SURGERY DRINK. NOTHING BY MOUTH AFTER THE ENSURE DRINK!    BRUSH YOUR TEETH MORNING OF SURGERY AND RINSE YOUR MOUTH OUT, NO CHEWING GUM CANDY OR MINTS.     CLEAR LIQUID DIET   Foods Allowed                                                                     Foods Excluded  Coffee and tea, regular and decaf                             liquids that you cannot  Plain Jell-O in any flavor                                             see through such as: Fruit ices (not with fruit pulp)                                     milk, soups, orange juice  Iced Popsicles                                    All solid food Carbonated beverages, regular and diet                                    Cranberry, grape and apple juices Sports drinks like Gatorade Lightly seasoned clear broth or consume(fat free) Sugar, honey syrup  Sample Menu Breakfast                                Lunch                                     Supper Cranberry juice                    Beef broth                            Chicken broth Jell-O  Grape juice                           Apple juice Coffee or tea                        Jell-O                                      Popsicle                                                Coffee or tea                        Coffee or tea  _____________________________________________________________________     Take these medicines the morning of surgery with A SIP OF WATER:   topiramate if needed, rizatriptan if needed, venlafaxine   Do not wear jewelry, make-up or nail polish.  Do not wear lotions, powders, or perfumes, or deoderant.  Do not shave 48 hours prior to surgery.  Men may shave face and neck.  Do not bring valuables to the [facility].  [facility] is not responsible for any belongings or valuables.   Contacts, dentures or bridgework may not be worn into surgery.  Leave your suitcase in the car.  After surgery it may be brought to your room.  For patients admitted to the [facility], discharge time will be determined by your treatment team.  Patients discharged the day of surgery will not be allowed to drive home.   Special instructions: n/a  Please read over the following fact sheets that you were given:               [facility] - Preparing for Surgery Before surgery, you can play an important role.  Because skin is not sterile, your skin needs to be as free of germs as possible.  You can reduce the number of germs on your skin by washing with CHG (chlorahexidine gluconate) soap before surgery.  CHG is an antiseptic cleaner which kills germs and bonds with the skin to continue killing germs even after washing. Please DO NOT use if you have an allergy to CHG or antibacterial soaps.  If your skin becomes reddened/irritated stop using the CHG and inform your nurse when you arrive at Short Stay. Do not shave (including legs and underarms) for at least 48 hours prior to the first CHG shower.  You may shave your face/neck. Please follow these instructions carefully:  1.  Shower with CHG Soap the night before surgery and the  morning of Surgery.  2.  If you choose to wash your hair, wash your hair first as usual with your  normal  shampoo.  3.  After you shampoo, rinse your hair and body thoroughly to remove the  shampoo.                           4.  Use CHG as you would any other liquid soap.  You can apply chg directly  to the skin and wash  Gently with a scrungie or clean washcloth.  5.  Apply the CHG Soap to your body ONLY FROM THE NECK DOWN.   Do not use on face/ open                           Wound or open sores. Avoid contact with eyes, ears mouth and genitals (private parts).                       Wash face,  Genitals (private parts) with your normal soap.             6.  Wash thoroughly, paying special attention to the area where your surgery  will be performed.  7.  Thoroughly rinse your body with warm water from the neck down.  8.  DO NOT shower/wash with your normal soap after using and rinsing off  the CHG Soap.                9.  Pat yourself dry with a clean towel.            10.  Wear clean pajamas.            11.  Place clean sheets on your bed the night of your first shower and do not  sleep with pets. Day of Surgery : Do not apply any lotions/deodorants the morning of surgery.  Please wear clean clothes to the [facility]/surgery center.  FAILURE TO FOLLOW THESE INSTRUCTIONS MAY RESULT IN THE CANCELLATION OF YOUR SURGERY PATIENT SIGNATURE_________________________________  NURSE SIGNATURE__________________________________  ________________________________________________________________________

## 2019-02-22 NOTE — Progress Notes (Signed)
lov neuro 11-11-2018 epic

## 2019-02-22 NOTE — Progress Notes (Signed)
Unable to reach patient to screen for covid. lvmm on listed cell number

## 2019-02-23 ENCOUNTER — Other Ambulatory Visit: Payer: Self-pay

## 2019-02-23 ENCOUNTER — Encounter (HOSPITAL_COMMUNITY): Payer: Self-pay

## 2019-02-23 ENCOUNTER — Other Ambulatory Visit (HOSPITAL_COMMUNITY)
Admission: RE | Admit: 2019-02-23 | Discharge: 2019-02-23 | Disposition: A | Discharge status: Home / Self Care | Payer: Medicaid Other | Source: Ambulatory Visit | Attending: Obstetrics and Gynecology | Admitting: Obstetrics and Gynecology

## 2019-02-23 ENCOUNTER — Encounter (HOSPITAL_COMMUNITY)
Admission: RE | Admit: 2019-02-23 | Discharge: 2019-02-23 | Disposition: A | Discharge status: Home / Self Care | Payer: Medicaid Other | Source: Ambulatory Visit | Attending: Obstetrics and Gynecology | Admitting: Obstetrics and Gynecology

## 2019-02-23 DIAGNOSIS — Z87442 Personal history of urinary calculi: Secondary | ICD-10-CM | POA: Insufficient documentation

## 2019-02-23 DIAGNOSIS — Z01818 Encounter for other preprocedural examination: Secondary | ICD-10-CM | POA: Insufficient documentation

## 2019-02-23 DIAGNOSIS — Z79899 Other long term (current) drug therapy: Secondary | ICD-10-CM | POA: Diagnosis not present

## 2019-02-23 DIAGNOSIS — M797 Fibromyalgia: Secondary | ICD-10-CM | POA: Diagnosis not present

## 2019-02-23 DIAGNOSIS — N809 Endometriosis, unspecified: Secondary | ICD-10-CM | POA: Diagnosis not present

## 2019-02-23 DIAGNOSIS — Z1159 Encounter for screening for other viral diseases: Secondary | ICD-10-CM | POA: Diagnosis not present

## 2019-02-23 HISTORY — DX: Pelvic and perineal pain: R10.2

## 2019-02-23 HISTORY — DX: Palpitations: R00.2

## 2019-02-23 HISTORY — DX: Fibromyalgia: M79.7

## 2019-02-23 LAB — CBC
HCT: 43.3 % (ref 36.0–46.0)
Hemoglobin: 14.1 g/dL (ref 12.0–15.0)
MCH: 29.3 pg (ref 26.0–34.0)
MCHC: 32.6 g/dL (ref 30.0–36.0)
MCV: 90 fL (ref 80.0–100.0)
Platelets: 262 10*3/uL (ref 150–400)
RBC: 4.81 MIL/uL (ref 3.87–5.11)
RDW: 12.8 % (ref 11.5–15.5)
WBC: 6.1 10*3/uL (ref 4.0–10.5)
nRBC: 0 % (ref 0.0–0.2)

## 2019-02-23 LAB — ABO/RH: ABO/RH(D): A POS

## 2019-02-24 LAB — NOVEL CORONAVIRUS, NAA (HOSP ORDER, SEND-OUT TO REF LAB; TAT 18-24 HRS): SARS-CoV-2, NAA: NOT DETECTED

## 2019-02-26 NOTE — H&P (Signed)
38 year old female presents for TAH, BSO for pelvic pain, endometriosis refractory to medical management.  Past Medical History:  Diagnosis Date  . Fibromyalgia    mgd by PCP   . Gastritis   . History of anemia    during her pregnancy  . History of cellulitis    right foot - will finish antibiotic 07/05/2013  . History of kidney stones   . Lactating mother 06/2013  . Migraines   . Palpitations    years ago , had ekg that show some abnormal beats but was told they were not harmful, has not had palpitations in years    . Pelvic pain   . Retained orthopedic hardware 06/2013   left foot   Past Surgical History:  Procedure Laterality Date  . CESAREAN SECTION  07/02/2010  . CESAREAN SECTION  07/31/2012   Procedure: CESAREAN SECTION;  Surgeon: Jeani Hawking, MD;  Location: [facility];  Service: Obstetrics;  Laterality: N/A;  . DILATION AND EVACUATION  10/06/2007; 10/09/2007  . FOOT SURGERY Left   . HARDWARE REMOVAL Left 07/06/2013   Procedure: HARDWARE REMOVAL:LEFT FOOT;  Surgeon: Sheral Apley, MD;  Location: [facility];  Service: Orthopedics;  Laterality: Left;  . WISDOM TOOTH EXTRACTION     Prior to Admission medications   Medication Sig Start Date End Date Taking? Authorizing Provider  Ascorbic Acid (VITAMIN C) 1000 MG tablet Take 1,000 mg by mouth daily.    [provider]  Fremanezumab-vfrm (AJOVY) 225 MG/1.5ML SOSY Inject 225 mg into the skin every 30 (thirty) days. Last dose  Was 02-05-2019    [provider]  Levonorgestrel-Ethinyl Estradiol (AMETHIA,CAMRESE) 0.1-0.02 & 0.01 MG tablet Take 1 tablet by mouth at bedtime.  12/24/16   [provider]  Milnacipran (SAVELLA) 50 MG TABS tablet Take 50 mg by mouth 2 (two) times daily.  12/24/16   [provider]  ondansetron (ZOFRAN ODT) 4 MG disintegrating tablet Take 1 tablet (4 mg total) by mouth every 8 (eight) hours as needed. Patient taking differently: Take 4 mg by mouth every 8  (eight) hours as needed for nausea or vomiting.  04/10/18   Levert Feinstein, MD  rizatriptan (MAXALT-MLT) 10 MG disintegrating tablet Take 1 tablet (10 mg total) by mouth as needed for migraine. May repeat in 2 hours if needed 06/28/18   Levert Feinstein, MD  tiZANidine (ZANAFLEX) 4 MG tablet Take 4 mg by mouth every 8 (eight) hours as needed for muscle spasms.  01/22/17   [provider]  topiramate (TOPAMAX) 100 MG tablet Take 100 mg by mouth 2 (two) times daily.  01/22/17   [provider]  traMADol (ULTRAM) 50 MG tablet Take 1 tablet (50 mg total) by mouth every 6 (six) hours as needed for pain. 07/06/13   Sheral Apley, MD  venlafaxine XR (EFFEXOR-XR) 75 MG 24 hr capsule Take 1 capsule (75 mg total) by mouth daily with breakfast. Patient taking differently: Take 75 mg by mouth daily with breakfast.  06/28/18   Levert Feinstein, MD   Augmentin [amoxicillin-pot clavulanate]; Penicillins; Zithromax [azithromycin]; and Cymbalta [duloxetine hcl]  Family History  Problem Relation Age of Onset  . Anesthesia problems Mother        hyperventilates/panic attack  . Other Father        hit by car  . Migraines Sister   . Migraines Brother    Social History   Socioeconomic History  . Marital status: Married    Spouse  name: Not on file  . Number of children: Not on file  . Years of education: Not on file  . Highest education level: Not on file  Occupational History  . Not on file  Social Needs  . Financial resource strain: Not on file  . Food insecurity:    Worry: Not on file    Inability: Not on file  . Transportation needs:    Medical: Not on file    Non-medical: Not on file  Tobacco Use  . Smoking status: Never Smoker  . Smokeless tobacco: Never Used  Substance and Sexual Activity  . Alcohol use: No  . Drug use: No  . Sexual activity: Yes    Birth control/protection: None  Lifestyle  . Physical activity:    Days per week: Not on file    Minutes per session: Not on file  .  Stress: Not on file  Relationships  . Social connections:    Talks on phone: Not on file    Gets together: Not on file    Attends religious service: Not on file    Active member of club or organization: Not on file    Attends meetings of clubs or organizations: Not on file    Relationship status: Not on file  Other Topics Concern  . Not on file  Social History Narrative  . Not on file   There were no vitals taken for this visit. No results found for this or any previous visit (from the past 24 hour(s)). General alert and oriented Lung CTAB Car RRR Abdomen soft and non tender Pelvic tender uterus and tender cul de sac  IMPRESSION: Chronic Pelvic Pain Endometriosis  PLAN: TAH and BSO Risks reviewed Consent signed

## 2019-02-26 NOTE — Progress Notes (Signed)
Anesthesia Chart Review   Case:  [id number] Date/Time:  02/27/19 0730   Procedure:  HYSTERECTOMY ABDOMINAL WITH BILATERAL  SALPINGO-OOPHORECTOMY (Bilateral )   Anesthesia type:  General   Pre-op diagnosis:  ENDOMETRIOSIS   Location:  [facility] OR ROOM 3 / [facility]   Surgeon:  Marcelle Overlie, MD      DISCUSSION: 38 yo never smoker with h/o fibromyalgia, endometriosis scheduled for above procedure 02/27/2019 with Dr. Marcelle Overlie.    Negative COVID19 nasopharyngeal swab 02/23/2019.   Pt can proceed with planned procedure barring acute status change.  VS: BP 121/88   Pulse 85   Temp 37.3 C (Oral)   SpO2 100%   PROVIDERS: Richardean Chimera, MD is PCP    LABS: Labs reviewed: Acceptable for surgery. (all labs ordered are listed, but only abnormal results are displayed)  Labs Reviewed  CBC  TYPE AND SCREEN  ABO/RH     IMAGES:   EKG:   CV:  Past Medical History:  Diagnosis Date  . Fibromyalgia    mgd by PCP   . Gastritis   . History of anemia    during her pregnancy  . History of cellulitis    right foot - will finish antibiotic 07/05/2013  . History of kidney stones   . Lactating mother 06/2013  . Migraines   . Palpitations    years ago , had ekg that show some abnormal beats but was told they were not harmful, has not had palpitations in years    . Pelvic pain   . Retained orthopedic hardware 06/2013   left foot    Past Surgical History:  Procedure Laterality Date  . CESAREAN SECTION  07/02/2010  . CESAREAN SECTION  07/31/2012   Procedure: CESAREAN SECTION;  Surgeon: Jeani Hawking, MD;  Location: [facility];  Service: Obstetrics;  Laterality: N/A;  . DILATION AND EVACUATION  10/06/2007; 10/09/2007  . FOOT SURGERY Left   . HARDWARE REMOVAL Left 07/06/2013   Procedure: HARDWARE REMOVAL:LEFT FOOT;  Surgeon: Sheral Apley, MD;  Location: [facility];  Service: Orthopedics;  Laterality: Left;  . WISDOM TOOTH EXTRACTION       MEDICATIONS: . Fremanezumab-vfrm (AJOVY) 225 MG/1.5ML SOSY  . Milnacipran (SAVELLA) 50 MG TABS tablet  . ondansetron (ZOFRAN ODT) 4 MG disintegrating tablet  . rizatriptan (MAXALT-MLT) 10 MG disintegrating tablet  . tiZANidine (ZANAFLEX) 4 MG tablet  . topiramate (TOPAMAX) 100 MG tablet  . traMADol (ULTRAM) 50 MG tablet  . venlafaxine XR (EFFEXOR-XR) 75 MG 24 hr capsule  . Ascorbic Acid (VITAMIN C) 1000 MG tablet  . Levonorgestrel-Ethinyl Estradiol (AMETHIA,CAMRESE) 0.1-0.02 & 0.01 MG tablet   No current facility-administered medications for this encounter.    Janey Genta [facility] Pre-Surgical Testing [phone] 02/26/19 10:49 AM

## 2019-02-26 NOTE — Anesthesia Preprocedure Evaluation (Addendum)
Anesthesia Evaluation  Patient identified by MRN, date of birth, ID band Patient awake    Reviewed: Allergy & Precautions, NPO status , Patient's Chart, lab work & pertinent test results  History of Anesthesia Complications Negative for: history of anesthetic complications  Airway Mallampati: II  TM Distance: >3 FB Neck ROM: Full    Dental no notable dental hx. (+) Dental Advisory Given, Teeth Intact   Pulmonary neg pulmonary ROS,    Pulmonary exam normal breath sounds clear to auscultation       Cardiovascular negative cardio ROS Normal cardiovascular exam Rhythm:Regular Rate:Normal     Neuro/Psych  Headaches,    GI/Hepatic negative GI ROS, Neg liver ROS,   Endo/Other  negative endocrine ROS  Renal/GU negative Renal ROS     Musculoskeletal  (+) Fibromyalgia -  Abdominal   Peds  Hematology negative hematology ROS (+)   Anesthesia Other Findings Day of surgery medications reviewed with the patient.  Reproductive/Obstetrics Endometriosis                            Anesthesia Physical Anesthesia Plan  ASA: II  Anesthesia Plan: General   Post-op Pain Management:    Induction: Intravenous  PONV Risk Score and Plan: Treatment may vary due to age or medical condition, Ondansetron, Dexamethasone and Midazolam  Airway Management Planned: Oral ETT  Additional Equipment:   Intra-op Plan:   Post-operative Plan: Extubation in OR  Informed Consent: I have reviewed the patients History and Physical, chart, labs and discussed the procedure including the risks, benefits and alternatives for the proposed anesthesia with the patient or authorized representative who has indicated his/her understanding and acceptance.     Dental advisory given  Plan Discussed with: CRNA  Anesthesia Plan Comments: (See PAT note 02/23/2019, Jodell Cipro, PA-C)      Anesthesia Quick Evaluation

## 2019-02-27 ENCOUNTER — Inpatient Hospital Stay (HOSPITAL_BASED_OUTPATIENT_CLINIC_OR_DEPARTMENT_OTHER)
Admission: RE | Admit: 2019-02-27 | Discharge: 2019-03-02 | DRG: 743 | Disposition: A | Discharge status: Home / Self Care | Payer: Medicaid Other | Attending: Obstetrics and Gynecology | Admitting: Obstetrics and Gynecology

## 2019-02-27 ENCOUNTER — Encounter (HOSPITAL_COMMUNITY)
Admission: RE | Disposition: A | Discharge status: Home / Self Care | Payer: Self-pay | Source: Home / Self Care | Attending: Obstetrics and Gynecology

## 2019-02-27 ENCOUNTER — Inpatient Hospital Stay (HOSPITAL_BASED_OUTPATIENT_CLINIC_OR_DEPARTMENT_OTHER): Payer: Medicaid Other | Admitting: Anesthesiology

## 2019-02-27 ENCOUNTER — Other Ambulatory Visit: Payer: Self-pay

## 2019-02-27 ENCOUNTER — Inpatient Hospital Stay (HOSPITAL_BASED_OUTPATIENT_CLINIC_OR_DEPARTMENT_OTHER): Payer: Medicaid Other | Admitting: Physician Assistant

## 2019-02-27 ENCOUNTER — Encounter (HOSPITAL_BASED_OUTPATIENT_CLINIC_OR_DEPARTMENT_OTHER): Payer: Self-pay | Admitting: Emergency Medicine

## 2019-02-27 DIAGNOSIS — Z79899 Other long term (current) drug therapy: Secondary | ICD-10-CM

## 2019-02-27 DIAGNOSIS — G8929 Other chronic pain: Secondary | ICD-10-CM | POA: Diagnosis present

## 2019-02-27 DIAGNOSIS — Z87442 Personal history of urinary calculi: Secondary | ICD-10-CM | POA: Diagnosis not present

## 2019-02-27 DIAGNOSIS — M797 Fibromyalgia: Secondary | ICD-10-CM | POA: Diagnosis present

## 2019-02-27 DIAGNOSIS — R102 Pelvic and perineal pain: Secondary | ICD-10-CM | POA: Diagnosis present

## 2019-02-27 DIAGNOSIS — Z793 Long term (current) use of hormonal contraceptives: Secondary | ICD-10-CM

## 2019-02-27 DIAGNOSIS — Z90710 Acquired absence of both cervix and uterus: Secondary | ICD-10-CM | POA: Diagnosis present

## 2019-02-27 DIAGNOSIS — N809 Endometriosis, unspecified: Principal | ICD-10-CM | POA: Diagnosis present

## 2019-02-27 DIAGNOSIS — K08409 Partial loss of teeth, unspecified cause, unspecified class: Secondary | ICD-10-CM | POA: Diagnosis present

## 2019-02-27 HISTORY — PX: HYSTERECTOMY ABDOMINAL WITH SALPINGO-OOPHORECTOMY: SHX6792

## 2019-02-27 HISTORY — DX: Other complications of anesthesia, initial encounter: T88.59XA

## 2019-02-27 LAB — POCT PREGNANCY, URINE: Preg Test, Ur: NEGATIVE

## 2019-02-27 LAB — TYPE AND SCREEN
ABO/RH(D): A POS
Antibody Screen: NEGATIVE

## 2019-02-27 SURGERY — HYSTERECTOMY, ABDOMINAL, WITH SALPINGO-OOPHORECTOMY
Anesthesia: General | Site: Abdomen | Laterality: Bilateral

## 2019-02-27 MED ORDER — PROMETHAZINE HCL 25 MG/ML IJ SOLN
6.2500 mg | INTRAMUSCULAR | Status: DC | PRN
  Filled 2019-02-27: qty 1

## 2019-02-27 MED ORDER — BUPIVACAINE HCL (PF) 0.25 % IJ SOLN
INTRAMUSCULAR | Status: DC | PRN
  Administered 2019-02-27: 10 mL

## 2019-02-27 MED ORDER — KETOROLAC TROMETHAMINE 30 MG/ML IJ SOLN
30.0000 mg | Freq: Four times a day (QID) | INTRAMUSCULAR | Status: DC
  Administered 2019-02-27 – 2019-02-28 (×4): 30 mg via INTRAVENOUS
  Filled 2019-02-27 (×4): qty 1

## 2019-02-27 MED ORDER — HYDROMORPHONE HCL 1 MG/ML IJ SOLN
0.2000 mg | INTRAMUSCULAR | Status: DC | PRN
  Administered 2019-02-27 – 2019-02-28 (×6): 0.6 mg via INTRAVENOUS
  Filled 2019-02-27 (×6): qty 1

## 2019-02-27 MED ORDER — ACETAMINOPHEN 500 MG PO TABS
1000.0000 mg | ORAL_TABLET | Freq: Once | ORAL | Status: AC
  Administered 2019-02-27: 1000 mg via ORAL
  Filled 2019-02-27: qty 2

## 2019-02-27 MED ORDER — TRAMADOL HCL 50 MG PO TABS
50.0000 mg | ORAL_TABLET | Freq: Four times a day (QID) | ORAL | Status: DC | PRN
  Administered 2019-02-27 – 2019-03-01 (×4): 50 mg via ORAL
  Filled 2019-02-27 (×4): qty 1

## 2019-02-27 MED ORDER — SUGAMMADEX SODIUM 200 MG/2ML IV SOLN
INTRAVENOUS | Status: AC
  Filled 2019-02-27: qty 2

## 2019-02-27 MED ORDER — ONDANSETRON HCL 4 MG/2ML IJ SOLN
INTRAMUSCULAR | Status: DC | PRN
  Administered 2019-02-27: 4 mg via INTRAVENOUS

## 2019-02-27 MED ORDER — ROCURONIUM BROMIDE 10 MG/ML (PF) SYRINGE
PREFILLED_SYRINGE | INTRAVENOUS | Status: DC | PRN
  Administered 2019-02-27: 70 mg via INTRAVENOUS

## 2019-02-27 MED ORDER — CLINDAMYCIN PHOSPHATE 900 MG/50ML IV SOLN
900.0000 mg | INTRAVENOUS | Status: AC
  Administered 2019-02-27: 08:00:00 900 mg via INTRAVENOUS
  Filled 2019-02-27: qty 50

## 2019-02-27 MED ORDER — OXYCODONE HCL 5 MG PO TABS
5.0000 mg | ORAL_TABLET | Freq: Once | ORAL | Status: DC | PRN
  Filled 2019-02-27: qty 1

## 2019-02-27 MED ORDER — KETOROLAC TROMETHAMINE 30 MG/ML IJ SOLN
INTRAMUSCULAR | Status: AC
  Filled 2019-02-27: qty 1

## 2019-02-27 MED ORDER — HYDROMORPHONE HCL 2 MG PO TABS
2.0000 mg | ORAL_TABLET | ORAL | Status: DC | PRN
  Administered 2019-02-28 – 2019-03-02 (×9): 2 mg via ORAL
  Filled 2019-02-27 (×9): qty 1

## 2019-02-27 MED ORDER — MIDAZOLAM HCL 2 MG/2ML IJ SOLN
INTRAMUSCULAR | Status: AC
  Filled 2019-02-27: qty 2

## 2019-02-27 MED ORDER — LACTATED RINGERS IV SOLN
INTRAVENOUS | Status: DC
  Administered 2019-02-27 – 2019-03-01 (×6): via INTRAVENOUS

## 2019-02-27 MED ORDER — FENTANYL CITRATE (PF) 250 MCG/5ML IJ SOLN
INTRAMUSCULAR | Status: AC
  Filled 2019-02-27: qty 5

## 2019-02-27 MED ORDER — DEXAMETHASONE SODIUM PHOSPHATE 10 MG/ML IJ SOLN
INTRAMUSCULAR | Status: AC
  Filled 2019-02-27: qty 1

## 2019-02-27 MED ORDER — LIDOCAINE 2% (20 MG/ML) 5 ML SYRINGE
INTRAMUSCULAR | Status: DC | PRN
  Administered 2019-02-27: 60 mg via INTRAVENOUS

## 2019-02-27 MED ORDER — HYDROMORPHONE HCL 1 MG/ML IJ SOLN
0.2500 mg | INTRAMUSCULAR | Status: DC | PRN
  Administered 2019-02-27 (×3): 0.5 mg via INTRAVENOUS
  Filled 2019-02-27: qty 0.5

## 2019-02-27 MED ORDER — SCOPOLAMINE 1 MG/3DAYS TD PT72
1.0000 | MEDICATED_PATCH | TRANSDERMAL | Status: DC
  Administered 2019-02-27: 07:00:00 1.5 mg via TRANSDERMAL
  Filled 2019-02-27: qty 1

## 2019-02-27 MED ORDER — CLINDAMYCIN PHOSPHATE 900 MG/50ML IV SOLN
INTRAVENOUS | Status: AC
  Filled 2019-02-27: qty 50

## 2019-02-27 MED ORDER — LACTATED RINGERS IV SOLN
INTRAVENOUS | Status: DC
  Filled 2019-02-27: qty 1000

## 2019-02-27 MED ORDER — VENLAFAXINE HCL ER 75 MG PO CP24
75.0000 mg | ORAL_CAPSULE | Freq: Every day | ORAL | Status: DC
  Administered 2019-02-28 – 2019-03-02 (×3): 75 mg via ORAL
  Filled 2019-02-27 (×3): qty 1

## 2019-02-27 MED ORDER — IBUPROFEN 800 MG PO TABS
800.0000 mg | ORAL_TABLET | Freq: Three times a day (TID) | ORAL | Status: DC | PRN
  Filled 2019-02-27: qty 1

## 2019-02-27 MED ORDER — PROPOFOL 10 MG/ML IV BOLUS
INTRAVENOUS | Status: AC
  Filled 2019-02-27: qty 40

## 2019-02-27 MED ORDER — LACTATED RINGERS IV SOLN
INTRAVENOUS | Status: DC
  Administered 2019-02-27 (×2): via INTRAVENOUS
  Filled 2019-02-27: qty 1000

## 2019-02-27 MED ORDER — HYDROMORPHONE HCL 1 MG/ML IJ SOLN
INTRAMUSCULAR | Status: AC
  Filled 2019-02-27: qty 1

## 2019-02-27 MED ORDER — SUGAMMADEX SODIUM 200 MG/2ML IV SOLN
INTRAVENOUS | Status: DC | PRN
  Administered 2019-02-27: 200 mg via INTRAVENOUS

## 2019-02-27 MED ORDER — MENTHOL 3 MG MT LOZG: 1.0000 | LOZENGE | OROMUCOSAL | Status: DC | PRN

## 2019-02-27 MED ORDER — PROPOFOL 10 MG/ML IV BOLUS
INTRAVENOUS | Status: DC | PRN
  Administered 2019-02-27: 140 mg via INTRAVENOUS

## 2019-02-27 MED ORDER — LIDOCAINE 2% (20 MG/ML) 5 ML SYRINGE
INTRAMUSCULAR | Status: AC
  Filled 2019-02-27: qty 5

## 2019-02-27 MED ORDER — FENTANYL CITRATE (PF) 100 MCG/2ML IJ SOLN
INTRAMUSCULAR | Status: DC | PRN
  Administered 2019-02-27 (×3): 50 ug via INTRAVENOUS

## 2019-02-27 MED ORDER — SCOPOLAMINE 1 MG/3DAYS TD PT72
MEDICATED_PATCH | TRANSDERMAL | Status: AC
  Filled 2019-02-27: qty 1

## 2019-02-27 MED ORDER — ONDANSETRON HCL 4 MG/2ML IJ SOLN
INTRAMUSCULAR | Status: AC
  Filled 2019-02-27: qty 2

## 2019-02-27 MED ORDER — MIDAZOLAM HCL 2 MG/2ML IJ SOLN
INTRAMUSCULAR | Status: DC | PRN
  Administered 2019-02-27: 2 mg via INTRAVENOUS

## 2019-02-27 MED ORDER — DEXAMETHASONE SODIUM PHOSPHATE 10 MG/ML IJ SOLN
INTRAMUSCULAR | Status: DC | PRN
  Administered 2019-02-27: 5 mg via INTRAVENOUS

## 2019-02-27 MED ORDER — PHENYLEPHRINE 40 MCG/ML (10ML) SYRINGE FOR IV PUSH (FOR BLOOD PRESSURE SUPPORT)
PREFILLED_SYRINGE | INTRAVENOUS | Status: AC
  Filled 2019-02-27: qty 10

## 2019-02-27 MED ORDER — ARTIFICIAL TEARS OPHTHALMIC OINT
TOPICAL_OINTMENT | OPHTHALMIC | Status: AC
  Filled 2019-02-27: qty 3.5

## 2019-02-27 MED ORDER — OXYCODONE HCL 5 MG/5ML PO SOLN
5.0000 mg | Freq: Once | ORAL | Status: DC | PRN
  Filled 2019-02-27: qty 5

## 2019-02-27 MED ORDER — GENTAMICIN SULFATE 40 MG/ML IJ SOLN
5.0000 mg/kg | INTRAVENOUS | Status: AC
  Administered 2019-02-27: 280 mg via INTRAVENOUS
  Filled 2019-02-27 (×2): qty 7

## 2019-02-27 MED ORDER — ACETAMINOPHEN 500 MG PO TABS
ORAL_TABLET | ORAL | Status: AC
  Filled 2019-02-27: qty 2

## 2019-02-27 MED ORDER — PHENYLEPHRINE HCL (PRESSORS) 10 MG/ML IV SOLN
INTRAVENOUS | Status: DC | PRN
  Administered 2019-02-27 (×3): 80 ug via INTRAVENOUS

## 2019-02-27 SURGICAL SUPPLY — 51 items
ADH SKN CLS APL DERMABOND .7 (GAUZE/BANDAGES/DRESSINGS)
APL PRP STRL LF DISP 70% ISPRP (MISCELLANEOUS) ×1
APL SKNCLS STERI-STRIP NONHPOA (GAUZE/BANDAGES/DRESSINGS) ×1
BAG URINE DRAINAGE (UROLOGICAL SUPPLIES) ×3 IMPLANT
BENZOIN TINCTURE PRP APPL 2/3 (GAUZE/BANDAGES/DRESSINGS) ×3 IMPLANT
BLADE EXTENDED COATED 6.5IN (ELECTRODE) IMPLANT
BLADE HEX COATED 2.75 (ELECTRODE) ×3 IMPLANT
BRR ADH 6X5 SEPRAFILM 1 SHT (MISCELLANEOUS)
CANISTER SUCT 3000ML PPV (MISCELLANEOUS) ×3 IMPLANT
CHLORAPREP W/TINT 26 (MISCELLANEOUS) ×3 IMPLANT
CLOSURE WOUND 1/2 X4 (GAUZE/BANDAGES/DRESSINGS) ×1
COVER WAND RF STERILE (DRAPES) ×3 IMPLANT
DECANTER SPIKE VIAL GLASS SM (MISCELLANEOUS) IMPLANT
DERMABOND ADVANCED (GAUZE/BANDAGES/DRESSINGS)
DERMABOND ADVANCED .7 DNX12 (GAUZE/BANDAGES/DRESSINGS) IMPLANT
DRAPE WARM FLUID 44X44 (DRAPE) ×3 IMPLANT
DRSG OPSITE POSTOP 4X10 (GAUZE/BANDAGES/DRESSINGS) ×3 IMPLANT
DURAPREP 26ML APPLICATOR (WOUND CARE) IMPLANT
GLOVE BIO SURGEON STRL SZ 6.5 (GLOVE) ×2 IMPLANT
GLOVE BIO SURGEONS STRL SZ 6.5 (GLOVE) ×1
GLOVE BIOGEL PI IND STRL 7.0 (GLOVE) ×4 IMPLANT
GLOVE BIOGEL PI INDICATOR 7.0 (GLOVE) ×8
GOWN STRL REUS W/TWL LRG LVL3 (GOWN DISPOSABLE) ×9 IMPLANT
HOLDER FOLEY CATH W/STRAP (MISCELLANEOUS) ×3 IMPLANT
KIT TURNOVER CYSTO (KITS) ×3 IMPLANT
NEEDLE HYPO 22GX1.5 SAFETY (NEEDLE) ×3 IMPLANT
NS IRRIG 1000ML POUR BTL (IV SOLUTION) ×9 IMPLANT
NS IRRIG 500ML POUR BTL (IV SOLUTION) ×3 IMPLANT
PACK ABDOMINAL GYN (CUSTOM PROCEDURE TRAY) ×3 IMPLANT
PAD OB MATERNITY 4.3X12.25 (PERSONAL CARE ITEMS) ×3 IMPLANT
SEPRAFILM MEMBRANE 5X6 (MISCELLANEOUS) IMPLANT
SPONGE LAP 18X18 RF (DISPOSABLE) ×6 IMPLANT
SPONGE LAP 4X18 RFD (DISPOSABLE) IMPLANT
STAPLER VISISTAT 35W (STAPLE) IMPLANT
STRIP CLOSURE SKIN 1/2X4 (GAUZE/BANDAGES/DRESSINGS) ×2 IMPLANT
SUT PDS AB 0 CT1 36 (SUTURE) IMPLANT
SUT PDS AB 0 CTX 60 (SUTURE) IMPLANT
SUT PLAIN 2 0 XLH (SUTURE) IMPLANT
SUT VIC AB 0 CT1 18XCR BRD8 (SUTURE) ×2 IMPLANT
SUT VIC AB 0 CT1 27 (SUTURE) ×12
SUT VIC AB 0 CT1 27XBRD ANBCTR (SUTURE) ×4 IMPLANT
SUT VIC AB 0 CT1 8-18 (SUTURE) ×4
SUT VIC AB 3-0 PS1 18 (SUTURE)
SUT VIC AB 3-0 PS1 18X BRD (SUTURE) IMPLANT
SUT VIC AB 4-0 KS 27 (SUTURE) ×3 IMPLANT
SUT VICRYL 0 TIES 12 18 (SUTURE) ×3 IMPLANT
SYR BULB IRRIGATION 50ML (SYRINGE) ×3 IMPLANT
SYR CONTROL 10ML LL (SYRINGE) ×3 IMPLANT
TOWEL OR 17X26 10 PK STRL BLUE (TOWEL DISPOSABLE) ×6 IMPLANT
TRAY FOLEY W/BAG SLVR 14FR (SET/KITS/TRAYS/PACK) ×3 IMPLANT
WATER STERILE IRR 500ML POUR (IV SOLUTION) ×3 IMPLANT

## 2019-02-27 NOTE — Transfer of Care (Signed)
Immediate Anesthesia Transfer of Care Note  Patient: Alicia [facility]  Procedure(s) Performed: HYSTERECTOMY ABDOMINAL WITH BILATERAL  SALPINGO-OOPHORECTOMY (Bilateral Abdomen)  Patient Location: PACU  Anesthesia Type:General  Level of Consciousness: awake, alert , oriented and patient cooperative  Airway & Oxygen Therapy: Patient Spontanous Breathing and Patient connected to nasal cannula oxygen  Post-op Assessment: Report given to RN and Post -op Vital signs reviewed and stable  Post vital signs: Reviewed and stable  Last Vitals:  Vitals Value Taken Time  BP    Temp    Pulse    Resp    SpO2      Last Pain:  Vitals:   02/27/19 0543  TempSrc: Oral      Patients Stated Pain Goal: 3 (02/27/19 0543)  Complications: No apparent anesthesia complications

## 2019-02-27 NOTE — Brief Op Note (Signed)
02/27/2019  8:49 AM  PATIENT:  Max Sane  38 y.o. female  PRE-OPERATIVE DIAGNOSIS: Pelvic Pain  POST-OPERATIVE DIAGNOSIS:   Same  PROCEDURE:  Procedure(s): HYSTERECTOMY ABDOMINAL WITH BILATERAL  SALPINGO-OOPHORECTOMY (Bilateral)  SURGEON:  Surgeon(s) and Role:    * Marcelle Overlie, MD - Primary    * Freddy Finner, MD - Assisting  PHYSICIAN ASSISTANT:   ASSISTANTS: none   ANESTHESIA:   general  EBL:  50 mL   BLOOD ADMINISTERED:none  DRAINS: Urinary Catheter (Foley)   LOCAL MEDICATIONS USED:  LIDOCAINE   SPECIMEN:  Source of Specimen:  cervix, uterus, tubes and ovaries  DISPOSITION OF SPECIMEN:  PATHOLOGY  COUNTS:  YES  TOURNIQUET:  * No tourniquets in log *  DICTATION: .Other Dictation: Dictation Number dictated  PLAN OF CARE: Admit to inpatient   PATIENT DISPOSITION:  PACU - hemodynamically stable.   Delay start of Pharmacological VTE agent (>24hrs) due to surgical blood loss or risk of bleeding: not applicable

## 2019-02-27 NOTE — Anesthesia Procedure Notes (Signed)
Procedure Name: Intubation Date/Time: 02/27/2019 7:32 AM Performed by: Tyrone Nine, CRNA Pre-anesthesia Checklist: Patient identified, Emergency Drugs available, Suction available, Patient being monitored and Timeout performed Patient Re-evaluated:Patient Re-evaluated prior to induction Oxygen Delivery Method: Circle system utilized Preoxygenation: Pre-oxygenation with 100% oxygen Induction Type: IV induction Ventilation: Mask ventilation without difficulty Tube size: 7.0 mm Number of attempts: 1 Airway Equipment and Method: Stylet Placement Confirmation: CO2 detector,  positive ETCO2,  breath sounds checked- equal and bilateral and ETT inserted through vocal cords under direct vision Secured at: 21 cm Tube secured with: Tape Dental Injury: Teeth and Oropharynx as per pre-operative assessment

## 2019-02-27 NOTE — Progress Notes (Signed)
H and P on the chart No changes Will proceed with TAH and BSO Consent signed

## 2019-02-27 NOTE — Anesthesia Postprocedure Evaluation (Signed)
Anesthesia Post Note  Patient: Alicia [facility]  Procedure(s) Performed: HYSTERECTOMY ABDOMINAL WITH BILATERAL  SALPINGO-OOPHORECTOMY (Bilateral Abdomen)     Patient location during evaluation: PACU Anesthesia Type: General Level of consciousness: awake and alert Pain management: pain level controlled Vital Signs Assessment: post-procedure vital signs reviewed and stable Respiratory status: spontaneous breathing, nonlabored ventilation and respiratory function stable Cardiovascular status: blood pressure returned to baseline and stable Postop Assessment: no apparent nausea or vomiting Anesthetic complications: no    Last Vitals:  Vitals:   02/27/19 0930 02/27/19 0939  BP: 109/79   Pulse: 81 75  Resp: 11 12  Temp:    SpO2: 100% 100%    Last Pain:  Vitals:   02/27/19 0930  TempSrc:   PainSc: 4                  Kaylyn Layer

## 2019-02-28 ENCOUNTER — Encounter (HOSPITAL_BASED_OUTPATIENT_CLINIC_OR_DEPARTMENT_OTHER): Payer: Self-pay | Admitting: Obstetrics and Gynecology

## 2019-02-28 LAB — CBC
HCT: 30.2 % — ABNORMAL LOW (ref 36.0–46.0)
Hemoglobin: 9.4 g/dL — ABNORMAL LOW (ref 12.0–15.0)
MCH: 29 pg (ref 26.0–34.0)
MCHC: 31.1 g/dL (ref 30.0–36.0)
MCV: 93.2 fL (ref 80.0–100.0)
Platelets: 195 10*3/uL (ref 150–400)
RBC: 3.24 MIL/uL — ABNORMAL LOW (ref 3.87–5.11)
RDW: 12.9 % (ref 11.5–15.5)
WBC: 9.6 10*3/uL (ref 4.0–10.5)
nRBC: 0 % (ref 0.0–0.2)

## 2019-02-28 MED ORDER — IBUPROFEN 800 MG PO TABS
800.0000 mg | ORAL_TABLET | Freq: Three times a day (TID) | ORAL | Status: DC
  Administered 2019-02-28 – 2019-03-02 (×8): 800 mg via ORAL
  Filled 2019-02-28 (×7): qty 1

## 2019-02-28 MED ORDER — DIPHENHYDRAMINE HCL 25 MG PO CAPS
50.0000 mg | ORAL_CAPSULE | Freq: Three times a day (TID) | ORAL | Status: DC | PRN
  Administered 2019-02-28 – 2019-03-01 (×2): 50 mg via ORAL
  Filled 2019-02-28 (×2): qty 2

## 2019-02-28 NOTE — Progress Notes (Signed)
Patient is itching and pain 5/10.  BP 105/74 (BP Location: Right Arm)   Pulse 84   Temp 98.6 F (37 C) (Oral)   Resp 18   Ht 5\' 5"  (1.651 m)   Wt 56.5 kg   LMP 02/26/2017   SpO2 100%   BMI 20.73 kg/m  Abdomen is soft and non tender Bandage dry  Results for orders placed or performed during the hospital encounter of 02/27/19 (from the past 24 hour(s))  CBC     Status: Abnormal   Collection Time: 02/28/19  5:03 AM  Result Value Ref Range   WBC 9.6 4.0 - 10.5 K/uL   RBC 3.24 (L) 3.87 - 5.11 MIL/uL   Hemoglobin 9.4 (L) 12.0 - 15.0 g/dL   HCT 26.7 (L) 12.4 - 58.0 %   MCV 93.2 80.0 - 100.0 fL   MCH 29.0 26.0 - 34.0 pg   MCHC 31.1 30.0 - 36.0 g/dL   RDW 99.8 33.8 - 25.0 %   Platelets 195 150 - 400 K/uL   nRBC 0.0 0.0 - 0.2 %   POD # 1  Continue po Dilaudid/ibuprofen Remove Foley  Advance diet Benadryl Discharge home tomorrow

## 2019-02-28 NOTE — Op Note (Signed)
NAME: Alicia Wade, Alicia Wade MEDICAL RECORD PF:[id number] ACCOUNT [id number] DATE OF BIRTH:03-Jan-1981 FACILITY: [facility] LOCATION: [id number] PHYSICIAN:Tyden Kann Rosita Fire, MD  OPERATIVE REPORT  DATE OF PROCEDURE:  02/27/2019  PREOPERATIVE DIAGNOSES:  Chronic pelvic pain, history of endometriosis.  POSTOPERATIVE DIAGNOSES:  Chronic pelvic pain, history of endometriosis.  PROCEDURE:  Total abdominal hysterectomy and bilateral salpingo-oophorectomy.  SURGEON:  Marcelle Overlie, MD  ASSISTANT:  Varney Baas, MD  ESTIMATED BLOOD LOSS:  Less than 100 mL.  COMPLICATIONS:  None.  DRAINS:  Foley catheter.  PATHOLOGY:  Cervix, uterus, fallopian tubes, and ovaries.  DESCRIPTION OF PROCEDURE:  The patient was taken to the operating room.  She was intubated and she was prepped and draped in the usual sterile fashion.  A Foley catheter was inserted.  A low transverse incision was made, carried down to the fascia.   Fascia was scored in the midline and extended laterally.  The rectus muscles were divided in the midline.  The peritoneum was then divided.  We then placed a self-retaining retractor in the abdominal cavity and visualized the uterus.  It appeared there  might be some residual endometriosis on the surface of the uterus, ovaries were unremarkable and no major pelvic adhesions were noted.  We proceeded with the hysterectomy in the following fashion by identifying the right round ligament on the right side,  suture ligating it and developing the anterior and posterior leaves of the broad ligament in standard fashion.  This was done on the right side and on the left side.  I then created an avascular window beneath the right tube and ovary and placed the  curved Heaney clamp across the infundibulopelvic ligament on the right.  The pedicle was clamped, cut and suture ligated using 0 Vicryl suture and further secured using a free tie of 0 Vicryl suture.  This was performed on the left side as well.  We  then  developed the bladder flap in the standard fashion and then clamped the uterine artery at the level of the internal os with curved Heaney clamps.  Each pedicle was clamped, cut and suture ligated using 0 Vicryl suture.  We developed the bladder flap  further and then placed straight Heaney clamps just beside the cervix, clamping the uterosacral cardinal ligaments.  Each pedicle was clamped, cut and suture ligated using 0 Vicryl suture.  We then reached the level of the external os and the pedicles  were then clamped, cut and suture ligated using 0 Vicryl suture.  The specimen had been removed and was noted to be cervix, uterus, fallopian tubes, and ovaries.  The vaginal cuff was closed using 0 Vicryl.  The irrigation was performed.  Hemostasis at  each surgical site was normal.  The peritoneum was closed using 0 Vicryl.  After all instruments and laparotomy pads were removed.  The rectus muscles were reapproximated.  The fascia was closed using 0 Vicryl starting in each corner and meeting in the  midline.  The subcuticular was closed with 3-0 Vicryl on a Keith needle.  Steri-Strips and a honeycomb dressing were applied.    The patient tolerated the procedure well and went to recovery room in stable condition.  AN/NUANCE  D:02/28/2019 T:02/28/2019 JOB:[id number]

## 2019-03-01 MED ORDER — HYDROMORPHONE HCL 2 MG PO TABS: 2.0000 mg | ORAL_TABLET | ORAL | 0 refills | Status: AC | PRN

## 2019-03-01 MED ORDER — IBUPROFEN 800 MG PO TABS: 800.0000 mg | ORAL_TABLET | Freq: Three times a day (TID) | ORAL | 0 refills | Status: AC

## 2019-03-01 NOTE — Addendum Note (Signed)
Addendum  created 03/01/19 1029 by Tyrone Nine, CRNA   Intraprocedure Meds edited

## 2019-03-01 NOTE — Plan of Care (Signed)
  Problem: Education: Goal: Knowledge of General Education information will improve Description Including pain rating scale, medication(s)/side effects and non-pharmacologic comfort measures Outcome: Progressing   Problem: Clinical Measurements: Goal: Ability to maintain clinical measurements within normal limits will improve Outcome: Progressing Goal: Will remain free from infection Outcome: Progressing Goal: Respiratory complications will improve Outcome: Progressing Goal: Cardiovascular complication will be avoided Outcome: Progressing   Problem: Activity: Goal: Risk for activity intolerance will decrease Outcome: Progressing   Problem: Elimination: Goal: Will not experience complications related to urinary retention Outcome: Progressing   Problem: Pain Managment: Goal: General experience of comfort will improve Outcome: Progressing

## 2019-03-01 NOTE — Discharge Summary (Signed)
Admission Diagnosis: Pelvic Pain Endometriosis  Discharge Diagnosis: Same  Hospital Course: 38 year old female admitted for TAH, BSO. She had an uncomplicated surgery. Did well post op. By POD # 2 was ambulating, voiding and tolerating regular diet and only had gas pain. BP 104/73 (BP Location: Right Arm)   Pulse 79   Temp 98.3 F (36.8 C) (Oral)   Resp 14   Ht 5\' 5"  (1.651 m)   Wt 56.5 kg   LMP 02/26/2017   SpO2 99%   BMI 20.73 kg/m  No results found for this or any previous visit (from the past 24 hour(s)). Abdomen soft and non tender  Bandage clean and dry  Discharged home in good condition Rx Oxycodone and Rx Ibuprofen given to patient Follow up in 1 week

## 2019-03-02 NOTE — Progress Notes (Signed)
Patient stayed overnight secondary to pain . Pain better today. +BM Voiding well  Abdomen soft and non tender Bandage clean and dry  Discharge home No changes with discharge summary Rx's at the pharmacy

## 2019-03-02 NOTE — Progress Notes (Signed)
Pt alert, oriented, tolerating diet.  D/C instructions and prescription was given, all questions answered.  Pt will be d/cd home.

## 2019-05-11 ENCOUNTER — Other Ambulatory Visit: Payer: Self-pay | Admitting: Neurology

## 2019-05-15 ENCOUNTER — Telehealth: Payer: Self-pay | Admitting: *Deleted

## 2019-05-15 NOTE — Telephone Encounter (Signed)
PA for Ajovy 225mg  submitted through [facility]. Pt LY#[id number] L.  KK#[id number] W.  Decision pending.

## 2019-05-15 NOTE — Telephone Encounter (Signed)
Approved through 05/09/2020. 

## 2019-07-18 ENCOUNTER — Ambulatory Visit: Payer: Medicaid Other | Admitting: Neurology

## 2019-07-29 ENCOUNTER — Other Ambulatory Visit: Payer: Self-pay | Admitting: Neurology

## 2019-08-28 ENCOUNTER — Telehealth: Payer: Self-pay | Admitting: Neurology

## 2019-08-28 NOTE — Telephone Encounter (Signed)
LVM for pt advising due to a schedule change her appt for 11/16 has been c/a and r/s with NP sarah on 12/3

## 2019-09-03 ENCOUNTER — Ambulatory Visit: Payer: Medicaid Other | Admitting: Neurology

## 2019-09-20 ENCOUNTER — Ambulatory Visit: Payer: Medicaid Other | Admitting: Neurology

## 2019-10-02 ENCOUNTER — Other Ambulatory Visit: Payer: Self-pay | Admitting: Neurology

## 2019-10-09 ENCOUNTER — Other Ambulatory Visit: Payer: Self-pay

## 2019-10-09 ENCOUNTER — Encounter: Payer: Self-pay | Admitting: Neurology

## 2019-10-09 ENCOUNTER — Ambulatory Visit: Payer: Medicaid Other | Admitting: Neurology

## 2019-10-09 VITALS — BP 107/69 | HR 83 | Temp 97.9°F | Ht 63.0 in | Wt 140.0 lb

## 2019-10-09 DIAGNOSIS — G43709 Chronic migraine without aura, not intractable, without status migrainosus: Secondary | ICD-10-CM | POA: Diagnosis not present

## 2019-10-09 DIAGNOSIS — IMO0002 Reserved for concepts with insufficient information to code with codable children: Secondary | ICD-10-CM

## 2019-10-09 MED ORDER — AJOVY 225 MG/1.5ML ~~LOC~~ SOSY: 225.0000 mg | PREFILLED_SYRINGE | SUBCUTANEOUS | 11 refills | Status: AC

## 2019-10-09 MED ORDER — ONDANSETRON 4 MG PO TBDP: 4.0000 mg | ORAL_TABLET | Freq: Three times a day (TID) | ORAL | 6 refills | Status: AC | PRN

## 2019-10-09 MED ORDER — RIZATRIPTAN BENZOATE 10 MG PO TBDP: ORAL_TABLET | ORAL | 3 refills | Status: DC

## 2019-10-09 MED ORDER — VENLAFAXINE HCL ER 75 MG PO CP24: ORAL_CAPSULE | ORAL | 3 refills | Status: DC

## 2019-10-09 NOTE — Patient Instructions (Signed)
Continue current medications, I have sent in refills.   Return in 6 months.

## 2019-10-09 NOTE — Progress Notes (Signed)
PATIENT: Alicia Wade DOB: Feb 22, 1981  REASON FOR VISIT: follow up HISTORY FROM: patient  HISTORY OF PRESENT ILLNESS: Today 10/09/19  HISTORY  HISTORY OF PRESENT ILLNESS: Alicia Gravenlizabeth S Priceis a [age] year old female, seen in refer by her primary care physician Dr. Richardean Chimeraaniel, Terry G for evaluation of headache, initial evaluation was on April 10, 2018.  She reported history of migraine headaches since age 89, her typical migraine are lateralized severe pounding headache with associated light noise sensitivity, she was treated with Topamax as preventive medication, which has been helpful,  Going stress of divorce since 2015, she began to have frequent headaches, she also reported history of chronic sinusitis, often preceding her severe headaches she would have strong metallic taste in her mouth, seems only involving one side of the nostril,  Since April 2019 she began to have more frequent headaches, almost daily basis, it can last for days, sometimes woke up in the morning with severe headaches,  Previously she has tried propanolol as migraine prevention did not help, amitriptyline caused significant side effect,  She had somewhat response to Imitrex as abortive treatment, but seems not much better than Excedrin Migraine.  UPDATE Sept 11 2019:YY I personally reviewed MRI of the brain that was normal in July 2019  Her headache has much improved,she tolerates Topamax 100 mg twice a day and the Effexor 37.5 mg once a day well, has headaches couple times each week,  UPDATE 1/28/2020CM [name] 38 year old female returns for follow-up with history of migraine headaches.  She is currently on Topamax 100 mg twice daily.  She is on Effexor 75 mg daily.  Most recently she was placed on Aimovig doing well with only 1 headache per month.  She denies any injection site issues.  She takes Maxalt acutely.  She is not aware of any migraine triggers except for stress and weather changes.   She is not certain about foods.  She has significant fibroids and pelvic pain and interstitial cystitis.  She is discussing a hysterectomy with her physician.  She also has a history of fibromyalgia.  She does not work.  She returns for reevaluation  Update October 09, 2019 SS: Ms. Alicia Wade is a 38 year old female with history of migraine headaches and fibromyalgia.  She remains on Topamax, Effexor, Ajovy, and Maxalt.  She had a hysterectomy in May 2020.  She reports for the last several months she has had an increase in headaches, 3-4 headaches a month.  She will take Maxalt with good benefit, there was one time she had to take it 3 days in a row.  She does report and increase in stress, her son is being evaluated for autism, she is caring for her 50102 year old grandmother. Before, she was only having 1 headache a month.  She presents today for evaluation unaccompanied.  REVIEW OF SYSTEMS: Out of a complete 14 system review of symptoms, the patient complains only of the following symptoms, and all other reviewed systems are negative.  Headache  ALLERGIES: Allergies  Allergen Reactions  . Augmentin [Amoxicillin-Pot Clavulanate] Diarrhea and Nausea And Vomiting    Did it involve swelling of the face/tongue/throat, SOB, or low BP? No Did it involve sudden or severe rash/hives, skin peeling, or any reaction on the inside of your mouth or nose? No Did you need to seek medical attention at a hospital or doctor's office? No When did it last happen?3-4 years ago If all above answers are "NO", may proceed with cephalosporin use.  Marland Kitchen. Penicillins  Hives    Did it involve swelling of the face/tongue/throat, SOB, or low BP? No Did it involve sudden or severe rash/hives, skin peeling, or any reaction on the inside of your mouth or nose? Yes Did you need to seek medical attention at a hospital or doctor's office? Yes When did it last happen?5-6 years ago If all above answers are "NO", may proceed with  cephalosporin use.  . Zithromax [Azithromycin] Diarrhea and Nausea And Vomiting  . Cymbalta [Duloxetine Hcl]     Hives     HOME MEDICATIONS: Outpatient Medications Prior to Visit  Medication Sig Dispense Refill  . AJOVY 225 MG/1.5ML SOSY INJECT 225 MG INTO THE SKIN AS NEEDED. 1.5 mL 5  . Milnacipran (SAVELLA) 50 MG TABS tablet Take 100 mg by mouth daily.     . ondansetron (ZOFRAN ODT) 4 MG disintegrating tablet Take 1 tablet (4 mg total) by mouth every 8 (eight) hours as needed. 20 tablet 6  . rizatriptan (MAXALT-MLT) 10 MG disintegrating tablet TAKE 1 TABLET BY MOUTH AS NEEDED FOR MIGRAINE. MAY REPEAT IN 2 HOURS IF NEEDED 7 tablet 1  . topiramate (TOPAMAX) 100 MG tablet Take 200 mg by mouth at bedtime.     . traMADol (ULTRAM) 50 MG tablet Take 1 tablet (50 mg total) by mouth every 6 (six) hours as needed for pain. 40 tablet 0  . venlafaxine XR (EFFEXOR-XR) 75 MG 24 hr capsule TAKE 1 CAPSULE BY MOUTH EVERY DAY WITH BREAKFAST 30 capsule 0  . HYDROmorphone (DILAUDID) 2 MG tablet Take 1 tablet (2 mg total) by mouth every 4 (four) hours as needed for severe pain. (Patient not taking: Reported on 10/09/2019) 30 tablet 0  . ibuprofen (ADVIL) 800 MG tablet Take 1 tablet (800 mg total) by mouth 3 (three) times daily. (Patient not taking: Reported on 10/09/2019) 30 tablet 0  . naproxen sodium (ALEVE) 220 MG tablet Take 220 mg by mouth 2 (two) times daily as needed (pain/headache).     No facility-administered medications prior to visit.    PAST MEDICAL HISTORY: Past Medical History:  Diagnosis Date  . Complication of anesthesia    "I am usually emotional when I wake up. I always cry."   . Fibromyalgia    mgd by PCP   . Gastritis   . History of anemia    during her pregnancy  . History of cellulitis    right foot - will finish antibiotic 07/05/2013  . History of kidney stones   . Lactating mother 06/2013  . Migraines   . Palpitations    years ago , had ekg that show some abnormal beats but  was told they were not harmful, has not had palpitations in years    . Pelvic pain   . Retained orthopedic hardware 06/2013   left foot    PAST SURGICAL HISTORY: Past Surgical History:  Procedure Laterality Date  . CESAREAN SECTION  07/02/2010  . CESAREAN SECTION  07/31/2012   Procedure: CESAREAN SECTION;  Surgeon: Jeani Hawking, MD;  Location: [facility];  Service: Obstetrics;  Laterality: N/A;  . DILATION AND EVACUATION  10/06/2007; 10/09/2007  . FOOT SURGERY Left   . HARDWARE REMOVAL Left 07/06/2013   Procedure: HARDWARE REMOVAL:LEFT FOOT;  Surgeon: Sheral Apley, MD;  Location: [facility];  Service: Orthopedics;  Laterality: Left;  . HYSTERECTOMY ABDOMINAL WITH SALPINGO-OOPHORECTOMY Bilateral 02/27/2019   Procedure: HYSTERECTOMY ABDOMINAL WITH BILATERAL  SALPINGO-OOPHORECTOMY;  Surgeon: Marcelle Overlie, MD;  Location: [facility];  Service: Gynecology;  Laterality: Bilateral;  . WISDOM TOOTH EXTRACTION      FAMILY HISTORY: Family History  Problem Relation Age of Onset  . Anesthesia problems Mother        hyperventilates/panic attack  . Other Father        hit by car  . Migraines Sister   . Migraines Brother     SOCIAL HISTORY: Social History   Socioeconomic History  . Marital status: Married    Spouse name: Not on file  . Number of children: Not on file  . Years of education: Not on file  . Highest education level: Not on file  Occupational History  . Not on file  Tobacco Use  . Smoking status: Never Smoker  . Smokeless tobacco: Never Used  Substance and Sexual Activity  . Alcohol use: No  . Drug use: No  . Sexual activity: Yes    Birth control/protection: None  Other Topics Concern  . Not on file  Social History Narrative  . Not on file   Social Determinants of Health   Financial Resource Strain:   . Difficulty of Paying Living Expenses: Not on file  Food Insecurity:   . Worried About Programme researcher, broadcasting/film/video in the Last Year:  Not on file  . Ran Out of Food in the Last Year: Not on file  Transportation Needs:   . Lack of Transportation (Medical): Not on file  . Lack of Transportation (Non-Medical): Not on file  Physical Activity:   . Days of Exercise per Week: Not on file  . Minutes of Exercise per Session: Not on file  Stress:   . Feeling of Stress : Not on file  Social Connections:   . Frequency of Communication with Friends and Family: Not on file  . Frequency of Social Gatherings with Friends and Family: Not on file  . Attends Religious Services: Not on file  . Active Member of Clubs or Organizations: Not on file  . Attends Banker Meetings: Not on file  . Marital Status: Not on file  Intimate Partner Violence:   . Fear of Current or Ex-Partner: Not on file  . Emotionally Abused: Not on file  . Physically Abused: Not on file  . Sexually Abused: Not on file    PHYSICAL EXAM  Vitals:   10/09/19 1010  BP: 107/69  Pulse: 83  Temp: 97.9 F (36.6 C)  Weight: 140 lb (63.5 kg)  Height: 5\' 3"  (1.6 m)   Body mass index is 24.8 kg/m.  Generalized: Well developed, in no acute distress   Neurological examination  Mentation: Alert oriented to time, place, history taking. Follows all commands speech and language fluent Cranial nerve II-XII: Pupils were equal round reactive to light. Extraocular movements were full, visual field were full on confrontational test. Facial sensation and strength were normal.  Head turning and shoulder shrug  were normal and symmetric. Motor: The motor testing reveals 5 over 5 strength of all 4 extremities. Good symmetric motor tone is noted throughout.  Sensory: Sensory testing is intact to soft touch on all 4 extremities. No evidence of extinction is noted.  Coordination: Cerebellar testing reveals good finger-nose-finger and heel-to-shin bilaterally.  Gait and station: Gait is normal. Tandem gait is normal. Romberg is negative. No drift is seen.  Reflexes:  Deep tendon reflexes are symmetric and normal bilaterally.   DIAGNOSTIC DATA (LABS, IMAGING, TESTING) - I reviewed patient records, labs, notes, testing and imaging myself where  available.  Lab Results  Component Value Date   WBC 9.6 02/28/2019   HGB 9.4 (L) 02/28/2019   HCT 30.2 (L) 02/28/2019   MCV 93.2 02/28/2019   PLT 195 02/28/2019      Component Value Date/Time   NA 135 04/24/2012 0814   K 3.5 04/24/2012 0814   CL 103 04/24/2012 0814   CO2 25 04/24/2012 0814   GLUCOSE 88 04/24/2012 0814   BUN 7 04/24/2012 0814   CREATININE 0.46 (L) 04/24/2012 0814   CALCIUM 8.7 04/24/2012 0814   PROT 6.4 04/24/2012 0814   ALBUMIN 2.9 (L) 04/24/2012 0814   AST 19 04/24/2012 0814   ALT 14 04/24/2012 0814   ALKPHOS 49 04/24/2012 0814   BILITOT 0.2 (L) 04/24/2012 0814   GFRNONAA >90 04/24/2012 0814   GFRAA >90 04/24/2012 0814   No results found for: CHOL, HDL, LDLCALC, LDLDIRECT, TRIG, CHOLHDL No results found for: HGBA1C No results found for: VITAMINB12 No results found for: TSH  ASSESSMENT AND PLAN 38 y.o. year old female  has a past medical history of Complication of anesthesia, Fibromyalgia, Gastritis, History of anemia, History of cellulitis, History of kidney stones, Lactating mother (06/2013), Migraines, Palpitations, Pelvic pain, and Retained orthopedic hardware (06/2013). here with:  1.  Chronic migraine headache -Overall, migraines remain under good control, 3 to 4/month, but has increased from 1 per month due to hysterectomy in May, increased family stress -Continue Ajovy 225 mg monthly injection -Continue Effexor XR 75 mg daily -Also taking Topamax, not prescribed from this office -Continue Maxalt, has good benefit with headache relief, continue zofran as needed for nausea with headache  -She will follow-up in 6 months needed, she will call for increase in headaches, hopefully her headaches will continue to improve with reduction in life stressors  I spent 15 minutes with  the patient. 50% of this time was spent discussing her plan of care.  Butler Denmark, AGNP-C, DNP 10/09/2019, 10:16 AM [facility] [address] [place], [place] [address] [phone]

## 2019-12-10 ENCOUNTER — Other Ambulatory Visit: Payer: Self-pay | Admitting: Orthopedic Surgery

## 2019-12-10 DIAGNOSIS — M259 Joint disorder, unspecified: Secondary | ICD-10-CM

## 2019-12-19 ENCOUNTER — Ambulatory Visit
Admission: RE | Admit: 2019-12-19 | Discharge: 2019-12-19 | Disposition: A | Discharge status: Home / Self Care | Payer: Medicaid Other | Source: Ambulatory Visit | Attending: Orthopedic Surgery | Admitting: Orthopedic Surgery

## 2019-12-19 ENCOUNTER — Other Ambulatory Visit: Payer: Self-pay

## 2019-12-19 DIAGNOSIS — M259 Joint disorder, unspecified: Secondary | ICD-10-CM

## 2019-12-19 NOTE — Progress Notes (Signed)
I have reviewed and agreed above plan. 

## 2020-04-09 ENCOUNTER — Ambulatory Visit: Payer: Medicaid Other | Admitting: Neurology

## 2020-04-24 ENCOUNTER — Other Ambulatory Visit: Payer: Self-pay | Admitting: Neurology

## 2020-05-15 ENCOUNTER — Ambulatory Visit: Payer: Medicaid Other | Admitting: Neurology

## 2020-05-15 ENCOUNTER — Telehealth (INDEPENDENT_AMBULATORY_CARE_PROVIDER_SITE_OTHER): Payer: Medicaid Other | Admitting: Neurology

## 2020-05-15 ENCOUNTER — Encounter: Payer: Self-pay | Admitting: Neurology

## 2020-05-15 DIAGNOSIS — IMO0002 Reserved for concepts with insufficient information to code with codable children: Secondary | ICD-10-CM

## 2020-05-15 DIAGNOSIS — G43709 Chronic migraine without aura, not intractable, without status migrainosus: Secondary | ICD-10-CM | POA: Diagnosis not present

## 2020-05-15 MED ORDER — VENLAFAXINE HCL ER 75 MG PO CP24: ORAL_CAPSULE | ORAL | 4 refills | Status: AC

## 2020-05-15 MED ORDER — RIZATRIPTAN BENZOATE 10 MG PO TBDP: ORAL_TABLET | ORAL | 6 refills | Status: AC

## 2020-05-15 NOTE — Progress Notes (Signed)
    PATIENT: Alicia Wade DOB: June 27, 1981  REASON FOR VISIT: follow up HISTORY FROM: patient  HISTORY OF PRESENT ILLNESS: Today 05/15/20  HISTORY  HISTORY OF PRESENT ILLNESS: Alicia Wade Priceis a 39 year old female, seen in refer by her primary care physician Dr. Richardean Chimera for evaluation of headache, initial evaluation was on April 10, 2018.  She reported history of migraine headaches since age 87, her typical migraine are lateralized severe pounding headache with associated light noise sensitivity, she was treated with Topamax as preventive medication, which has been helpful,  Going stress of divorce since 2015, she began to have frequent headaches, she also reported history of chronic sinusitis, often preceding her severe headaches she would have strong metallic taste in her mouth, seems only involving one side of the nostril,  Since April 2019 she began to have more frequent headaches, almost daily basis, it can last for days, sometimes woke up in the morning with severe headaches,  Previously she has tried propanolol as migraine prevention did not help, amitriptyline caused significant side effect,  She had somewhat response to Imitrex as abortive treatment, but seems not much better than Excedrin Migraine.  UPDATE Sept 11 2019:YY I personally reviewed MRI of the brain that was normal in July 2019  Her headache has much improved,she tolerates Topamax 100 mg twice a day and the Effexor 37.5 mg once a day well, has headaches couple times each week,  Virtual Visit via Video on May 15, 2020  I connected with Alicia Wade on 05/15/20 at  by Video and verified that I am speaking with the correct person using two identifiers.   I discussed the limitations, risks, security and privacy concerns of performing an evaluation and management service by video and the availability of in person appointments. I also discussed with the patient that there may be a patient  responsible charge related to this service. The patient expressed understanding and agreed to proceed.   History of Present Illness: She complains of excessive stress in the past few months, noticed mild increased headache, 2-3 times each month, but overall is under much better control compared to previous, tolerating Ajovy 220 mg once every months, Effexor XR 75 mg daily, also receiving Topamax 100 mg 2 tablets every night from her primary care physician,  After taking Maxalt 60 minutes or longer, she will notice significant improvement of her headaches, occasionally combine it with Zofran,   Observations/Objective: I have reviewed problem lists, medications, allergies.  She is awake, alert, oriented to history taking care of conversation, sitting behind the wheel, facial symmetric, moving arms without difficulty, no dysarthria, no aphasia Assessment and Plan:  ASSESSMENT AND PLAN 39 y.o. year old female  Chronic migraine headache -Overall, migraines remain under good control, 2-3/month, noticed increased stress recently -Continue Ajovy 225 mg monthly injection -Continue Effexor XR 75 mg daily -Also taking Topamax 200 mg every night, not prescribed from this office -Continue Maxalt, has good benefit with headache relief, continue zofran as needed for nausea with headache, Aleve as needed for severe prolonged headaches -She will follow-up in 6 months by virtual visit with Maralyn Sago  Total time spent reviewing the chart, obtaining history, examined patient, ordering tests, documentation, consultations and family, care coordination was 30 minutes     Alicia Wade, M.D. Ph.D.  [facility] [address] [place], [place] [address] Phone: [phone] Fax:      [phone]  Follow Up Instructions:

## 2020-06-17 ENCOUNTER — Ambulatory Visit: Payer: Self-pay | Admitting: Neurology

## 2021-01-05 ENCOUNTER — Other Ambulatory Visit: Payer: Self-pay | Admitting: Neurology

## 2021-03-18 IMAGING — CT CT BIOPSY
4 of 7 series · 13 of 32 positions shown, 18 images · non-contrast
Comparison: none

CLINICAL DATA: Right-sided low back buttock pain extending into the
legs. Suspected sacroiliac joint dysfunction.

[Series 2: needle -guided injection · axial · 0.79mm/px · z∈[-208,-124]mm · 7 of 57 slices shown, 12 images (1 of 4)]
[im 8/57  soft-tissue]
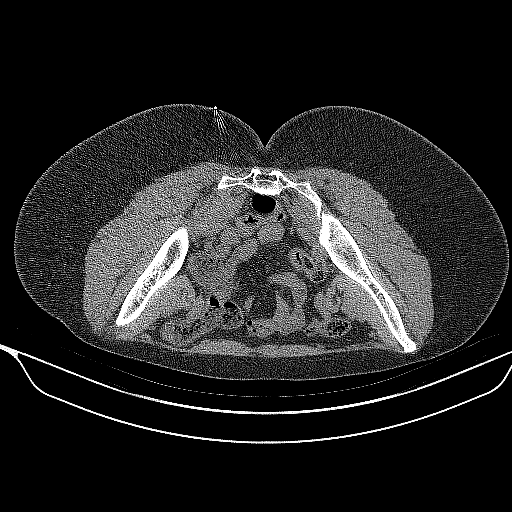
[im 8/57  bone]
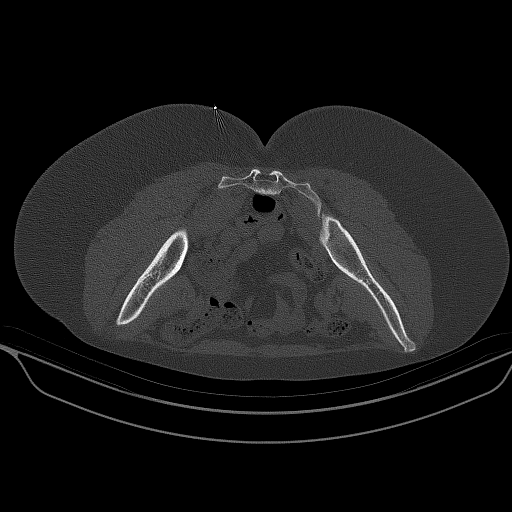
[im 15/57  soft-tissue]
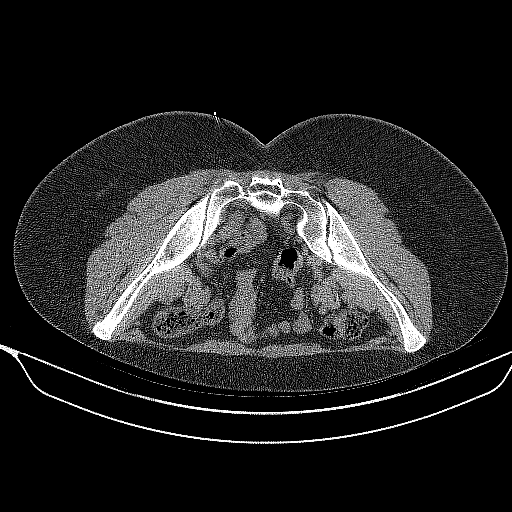
[im 22/57  soft-tissue]
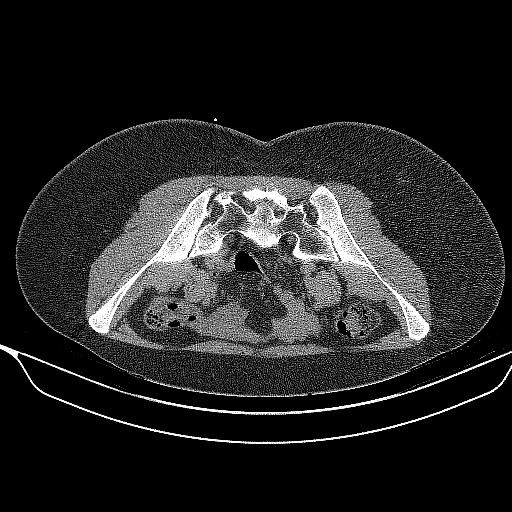
[im 29/57  soft-tissue]
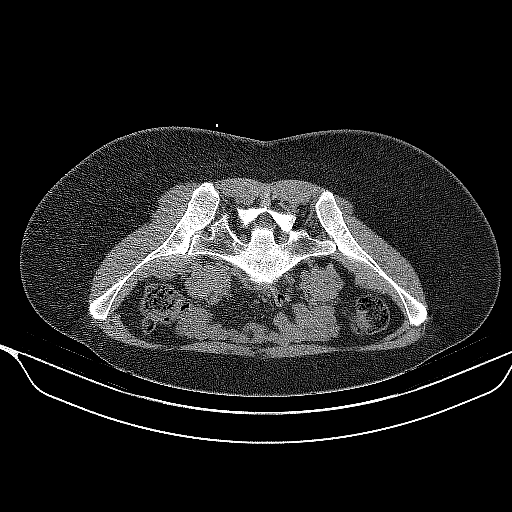
[im 29/57  lung]
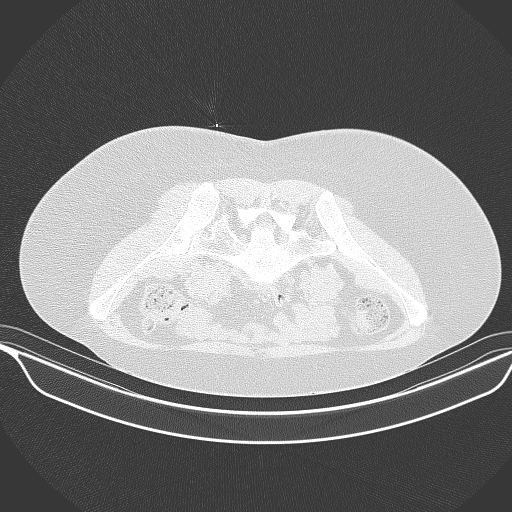
[im 36/57  soft-tissue]
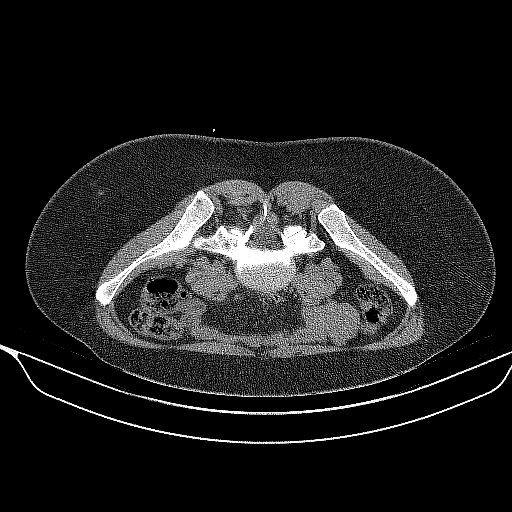
[im 36/57  lung]
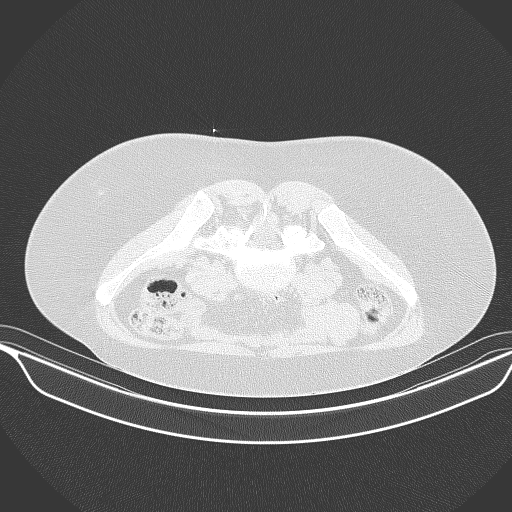
[im 43/57  soft-tissue]
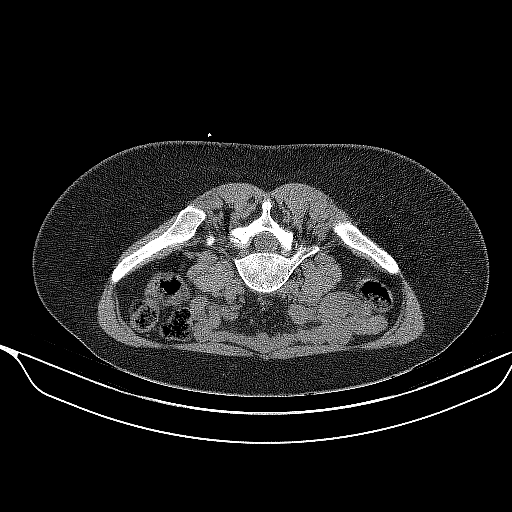
[im 43/57  lung]
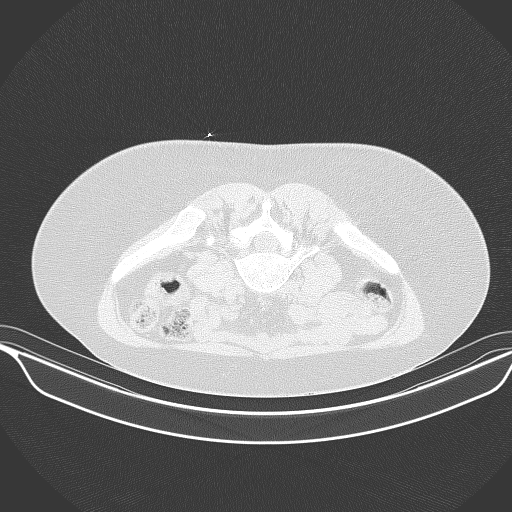
[im 50/57  soft-tissue]
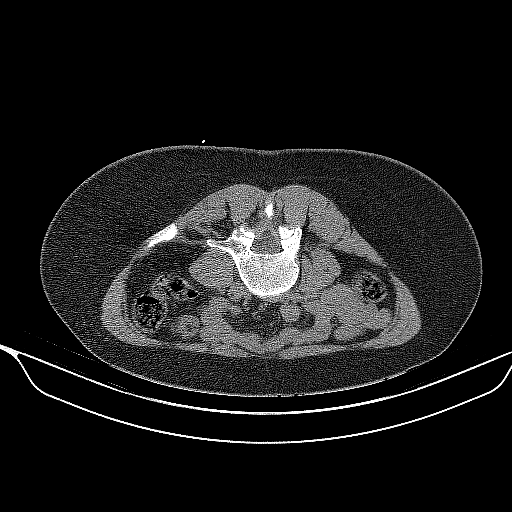
[im 50/57  lung]
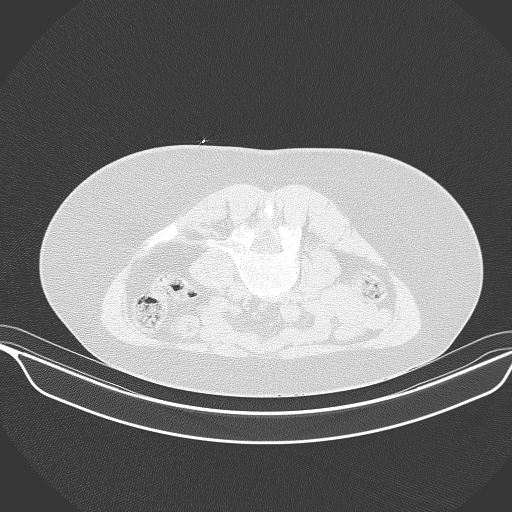

[Series 4: needle -guided injection · axial · 0.79mm/px · z∈[-204,-190]mm · 2 of 22 slices shown (2 of 4)]
[im 8/22  soft-tissue]
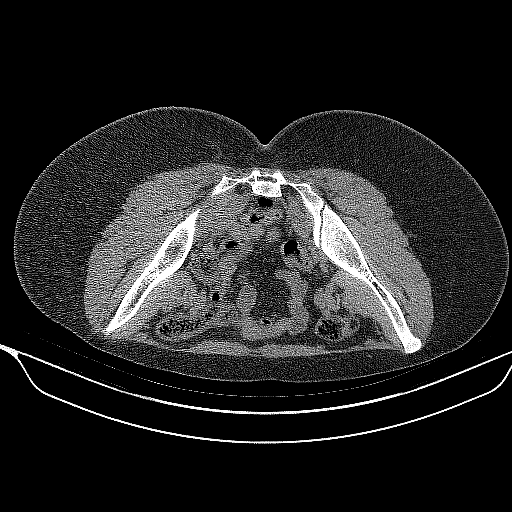
[im 15/22  soft-tissue]
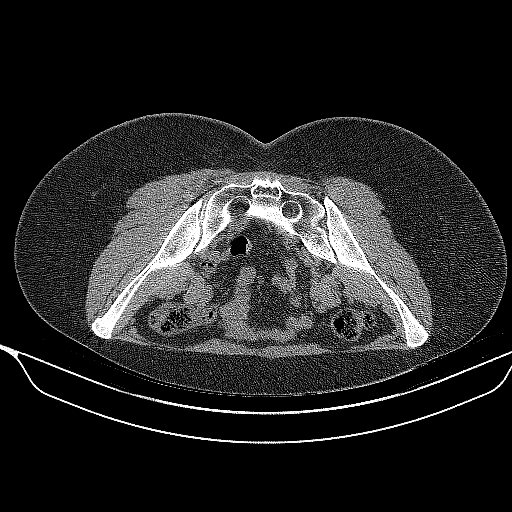

[Series 5: needle -guided injection · axial · 0.79mm/px · z∈[-204,-190]mm · 2 of 22 slices shown (3 of 4)]
[im 8/22  soft-tissue]
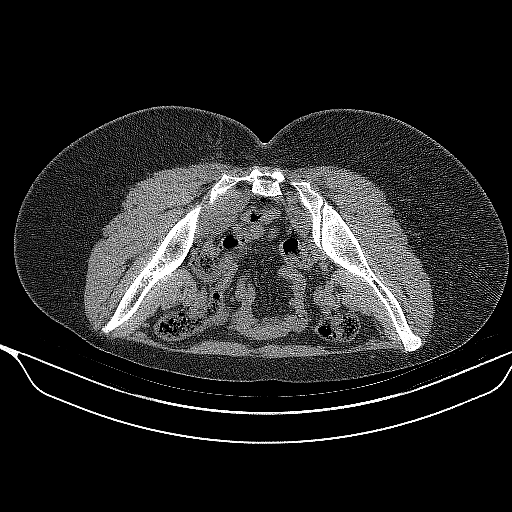
[im 15/22  soft-tissue]
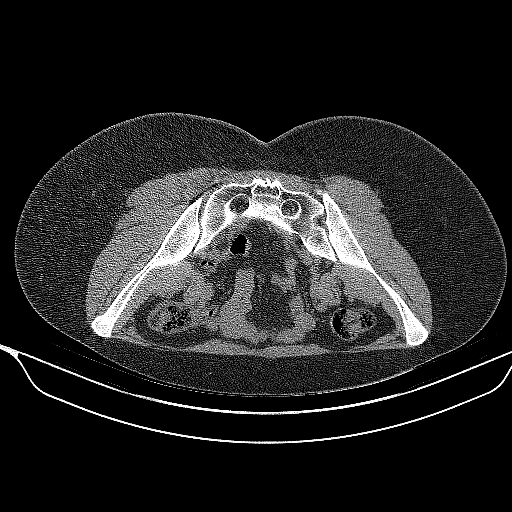

[Series 6: needle -guided injection · axial · 0.79mm/px · z∈[-204,-190]mm · 2 of 22 slices shown (4 of 4)]
[im 8/22  soft-tissue]
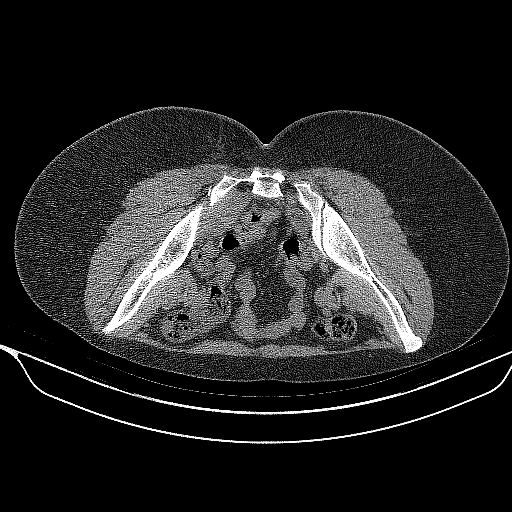
[im 15/22  soft-tissue]
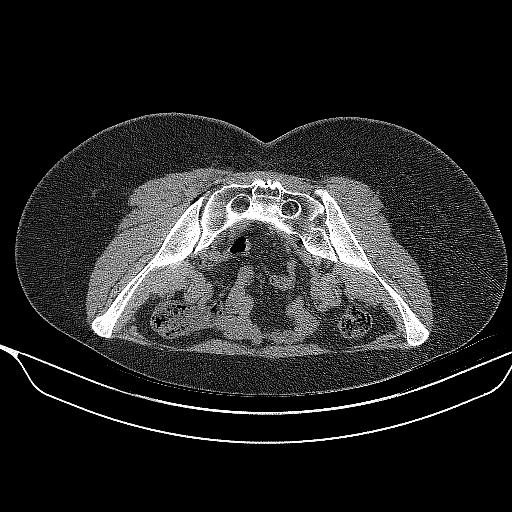

[13 of 32 positions shown; findings below may reference images not displayed]

EXAM:
CT GUIDED RIGHT SI JOINT INJECTION

PROCEDURE:
After a thorough discussion of risks and benefits of the procedure,
including bleeding, infection, injury to nerves, blood vessels, and
adjacent structures, verbal and written consent was obtained the
patient was placed prone on the CT table and localization was
performed over the sacrum. Target site marked using CT guidance. The
skin was prepped and draped in the usual sterile fashion using
Betadine soap.

After local anesthesia with 1% lidocaine without epinephrine and
subsequent deep anesthesia, a 3.5 inch 22 gauge spinal needle was
advanced into the right SI joint under intermittent CT guidance.

Injection of a small amount of Isovue-M 200 demonstrated
intra-articular contrast spread. Subsequently, 2 mL of 0.5%
bupivacaine were injected into the right SI joint. The needle was
removed and a sterile dressing applied.

No complications were observed.
IMPRESSION: Successful CT-guided right SI joint injection.

## 2021-07-02 ENCOUNTER — Other Ambulatory Visit: Payer: Self-pay | Admitting: Neurology
